# Patient Record
Sex: Male | Born: 1970 | Race: Black or African American | Hispanic: No | Marital: Single | Smoking: Never smoker
Health system: Southern US, Community
[De-identification: ages and names within clinical notes are randomized; demographics above are authoritative.]

## PROBLEM LIST (undated history)

## (undated) DIAGNOSIS — B20 Human immunodeficiency virus [HIV] disease: Secondary | ICD-10-CM

## (undated) DIAGNOSIS — N12 Tubulo-interstitial nephritis, not specified as acute or chronic: Secondary | ICD-10-CM

## (undated) DIAGNOSIS — I10 Essential (primary) hypertension: Secondary | ICD-10-CM

## (undated) DIAGNOSIS — G5600 Carpal tunnel syndrome, unspecified upper limb: Secondary | ICD-10-CM

## (undated) DIAGNOSIS — Z21 Asymptomatic human immunodeficiency virus [HIV] infection status: Secondary | ICD-10-CM

## (undated) HISTORY — PX: FRACTURE SURGERY: SHX138

---

## 2015-01-24 ENCOUNTER — Emergency Department (HOSPITAL_BASED_OUTPATIENT_CLINIC_OR_DEPARTMENT_OTHER)
Admission: EM | Admit: 2015-01-24 | Discharge: 2015-01-24 | Disposition: A | Discharge status: Home / Self Care | Payer: Self-pay | Attending: Emergency Medicine | Admitting: Emergency Medicine

## 2015-01-24 ENCOUNTER — Encounter (HOSPITAL_BASED_OUTPATIENT_CLINIC_OR_DEPARTMENT_OTHER): Payer: Self-pay | Admitting: *Deleted

## 2015-01-24 DIAGNOSIS — Z21 Asymptomatic human immunodeficiency virus [HIV] infection status: Secondary | ICD-10-CM | POA: Insufficient documentation

## 2015-01-24 DIAGNOSIS — Z79899 Other long term (current) drug therapy: Secondary | ICD-10-CM | POA: Insufficient documentation

## 2015-01-24 DIAGNOSIS — Z792 Long term (current) use of antibiotics: Secondary | ICD-10-CM | POA: Insufficient documentation

## 2015-01-24 DIAGNOSIS — I10 Essential (primary) hypertension: Secondary | ICD-10-CM | POA: Insufficient documentation

## 2015-01-24 DIAGNOSIS — L02415 Cutaneous abscess of right lower limb: Secondary | ICD-10-CM | POA: Insufficient documentation

## 2015-01-24 DIAGNOSIS — L03115 Cellulitis of right lower limb: Secondary | ICD-10-CM | POA: Insufficient documentation

## 2015-01-24 DIAGNOSIS — L02419 Cutaneous abscess of limb, unspecified: Secondary | ICD-10-CM

## 2015-01-24 DIAGNOSIS — L03119 Cellulitis of unspecified part of limb: Secondary | ICD-10-CM

## 2015-01-24 HISTORY — DX: Human immunodeficiency virus (HIV) disease: B20

## 2015-01-24 HISTORY — DX: Essential (primary) hypertension: I10

## 2015-01-24 HISTORY — DX: Asymptomatic human immunodeficiency virus (hiv) infection status: Z21

## 2015-01-24 MED ORDER — OXYCODONE-ACETAMINOPHEN 5-325 MG PO TABS: ORAL_TABLET | ORAL | Status: DC

## 2015-01-24 MED ORDER — SULFAMETHOXAZOLE-TRIMETHOPRIM 800-160 MG PO TABS
1.0000 | ORAL_TABLET | Freq: Once | ORAL | Status: AC
  Administered 2015-01-24: 1 via ORAL
  Filled 2015-01-24: qty 1

## 2015-01-24 MED ORDER — LIDOCAINE-EPINEPHRINE (PF) 2 %-1:200000 IJ SOLN
INTRAMUSCULAR | Status: AC
  Administered 2015-01-24: 10 mL
  Filled 2015-01-24: qty 20

## 2015-01-24 MED ORDER — SULFAMETHOXAZOLE-TRIMETHOPRIM 800-160 MG PO TABS: 1.0000 | ORAL_TABLET | Freq: Two times a day (BID) | ORAL | Status: DC

## 2015-01-24 MED ORDER — OXYCODONE-ACETAMINOPHEN 5-325 MG PO TABS
1.0000 | ORAL_TABLET | Freq: Once | ORAL | Status: AC
  Administered 2015-01-24: 1 via ORAL
  Filled 2015-01-24: qty 1

## 2015-01-24 MED ORDER — CEPHALEXIN 250 MG PO CAPS
500.0000 mg | ORAL_CAPSULE | Freq: Once | ORAL | Status: AC
  Administered 2015-01-24: 500 mg via ORAL
  Filled 2015-01-24: qty 2

## 2015-01-24 MED ORDER — LIDOCAINE-EPINEPHRINE (PF) 1 %-1:200000 IJ SOLN
20.0000 mL | Freq: Once | INTRAMUSCULAR | Status: AC
  Filled 2015-01-24: qty 20

## 2015-01-24 MED ORDER — MORPHINE SULFATE 4 MG/ML IJ SOLN
4.0000 mg | Freq: Once | INTRAMUSCULAR | Status: AC
  Administered 2015-01-24: 4 mg via INTRAMUSCULAR
  Filled 2015-01-24: qty 1

## 2015-01-24 MED ORDER — CEPHALEXIN 500 MG PO CAPS: 500.0000 mg | ORAL_CAPSULE | Freq: Four times a day (QID) | ORAL | Status: DC

## 2015-01-24 NOTE — Discharge Instructions (Signed)
Return to the emergency room for wound check and packing removal in 48 hours.        If you develop fever, have vomiting or if the swelling and redness starts spreading , return to the emergency room immediately for a recheck.  Do not hesitate to return to the emergency room for any new, worsening or concerning symptoms.  Please obtain primary care using resource guide below. But the minute you were seen in the emergency room and that they will need to obtain records for further outpatient management.    Cellulitis Cellulitis is an infection of the skin and the tissue under the skin. The infected area is usually red and tender. This happens most often in the arms and lower legs. HOME CARE   Take your antibiotic medicine as told. Finish the medicine even if you start to feel better.  Keep the infected arm or leg raised (elevated).  Put a warm cloth on the area up to 4 times per day.  Only take medicines as told by your doctor.  Keep all doctor visits as told. GET HELP IF:  You see red streaks on the skin coming from the infected area.  Your red area gets bigger or turns a dark color.  Your bone or joint under the infected area is painful after the skin heals.  Your infection comes back in the same area or different area.  You have a puffy (swollen) bump in the infected area.  You have new symptoms.  You have a fever. GET HELP RIGHT AWAY IF:   You feel very sleepy.  You throw up (vomit) or have watery poop (diarrhea).  You feel sick and have muscle aches and pains. MAKE SURE YOU:   Understand these instructions.  Will watch your condition.  Will get help right away if you are not doing well or get worse. Document Released: 05/01/2008 Document Revised: 03/30/2014 Document Reviewed: 01/29/2012 [facility] Patient Information 2015 [employer], [place]. This information is not intended to replace advice given to you by your health care provider. Make sure you discuss any  questions you have with your health care provider.   Emergency Department Resource Guide 1) Find a Doctor and Pay Out of Pocket Although you won't have to find out who is covered by your insurance plan, it is a good idea to ask around and get recommendations. You will then need to call the office and see if the doctor you have chosen will accept you as a new patient and what types of options they offer for patients who are self-pay. Some doctors offer discounts or will set up payment plans for their patients who do not have insurance, but you will need to ask so you aren't surprised when you get to your appointment.  2) Contact Your Local Health Department Not all health departments have doctors that can see patients for sick visits, but many do, so it is worth a call to see if yours does. If you don't know where your local health department is, you can check in your phone book. The CDC also has a tool to help you locate your state's health department, and many state websites also have listings of all of their local health departments.  3) Find a Walk-in Clinic If your illness is not likely to be very severe or complicated, you may want to try a walk in clinic. These are popping up all over the country in pharmacies, drugstores, and shopping centers. They're usually staffed by nurse  practitioners or physician assistants that have been trained to treat common illnesses and complaints. They're usually fairly quick and inexpensive. However, if you have serious medical issues or chronic medical problems, these are probably not your best option.  No Primary Care Doctor: - Call Health Connect at  [phone] - they can help you locate a primary care doctor that  accepts your insurance, provides certain services, etc. - Physician Referral Service- [phone]  Chronic Pain Problems: Organization         Address  Phone   Notes  [facility]  [phone] Patients need to be referred  by their primary care doctor.   Medication Assistance: Organization         Address  Phone   Notes  [facility] Medication [facility] [address] [place] [place], [place] [address] [phone] --Must be a resident of [facility] -- Must have NO insurance coverage whatsoever (no Medicaid/ Medicare, etc.) -- The pt. MUST have a primary care doctor that directs their care regularly and follows them in the community   [facility]  [phone]   [employer]  [phone]    Agencies that provide inexpensive medical care: Organization         Address  Phone   Notes  [facility]  [phone]   [facility]    [phone]   [facility] [address] [place], [place] [address] [phone]   [facility] [address] [place] [phone]   Planned Parenthood    [phone]   [facility]    [phone]   [facility]  [address], [place] Phone:  [phone], Fax:  [phone] Hours of Operation:  9 am - 6 pm, M-F.  Also accepts Medicaid/Medicare and self-pay.  [facility]  [address], [place] Phone: [phone], Fax: [phone]. Hours of Operation:  8:30 am - 5:30 pm, M-F.  Also accepts Medicaid and self-pay.  [facility] [place] [address] [place] Phone: [phone]   [facility] [address] Natasha Bence [place], [place] [phone], Ext. [phone] Mondays & Thursdays: 7-9 AM.  First 15 patients are seen on a first come, first serve basis.    Medicaid-accepting [facility] Providers:  Organization         Address  Phone   Notes  [facility] [address], [place] [phone] Also accepts self-pay patients.  [facility] [address] Laurell Josephs [place], [place]  [phone]   [facility] [address], [place] [phone]   [facility] [address] [place] [phone]   Renaye Rakers [address], [place]   [phone] Only accepts [place] patients after they have their name applied to their card.   Self-Pay (no insurance) in [facility]:  Organization         Address  Phone   Notes  Sickle Cell Patients, [facility] Medicine [address] [place], [place] [phone]   [facility] [address] [place], [place] [phone]   [facility] [place]  [address], [place] [phone]   [facility]/Dr. Julio Sicks  [address] [place] [address], [place] or [address], [place] [phone] Phone number for both [place] and [place] locations is the same.  [facility] [address], [place] [phone]) [phone]   [facility] [place] [address] [place] [address], [place] or [address] [phone]) [phone]   [facility] [address], [place] [phone](336) [phone], phone; [phone], fax Sees patients 1st and 3rd Saturday of every month.  Must not qualify for public or private insurance (i.e. Medicaid, Medicare, [place] Health Choice, Veterans' Benefits)  Household income should be no more than 200% of the poverty level The clinic cannot treat you if you are pregnant or think you are pregnant  Sexually transmitted diseases are not treated at the clinic.    Dental Care: Organization         Address  Phone  Notes  [facility] [facility] [address], [place] [phone]) [phone] Accepts children up to age 44 who are enrolled in [place] or [place] Health Choice; pregnant women with a Medicaid card; and children who have applied for Medicaid or [place] Health Choice, but were declined, whose parents can pay a  reduced fee at time of service.  [facility] [place]  [address], [place] [phone]) [phone] Accepts children up to age 44 who are enrolled in [place] or [place] Health Choice; pregnant women with a Medicaid card; and children who have applied for Medicaid or [place] Health Choice, but were declined, whose parents can pay a reduced fee at time of service.  [facility]  [address], [place] [phone] Patients are seen by appointment only. Walk-ins are not accepted. [facility] will see patients 44 years of age and older. Monday - Tuesday (8am-5pm) Most Wednesdays (8:30-5pm) $30 per visit, cash only  [facility]  [address], [place] [facility] [phone] Patients are seen by appointment only. Walk-ins are not accepted. [facility] will see patients 44 years of age and older. One Wednesday Evening (Monthly: Volunteer Based).  $30 per visit, cash only  [employer] [facility] [facility]  [phone] for adults; Children under age 124, call Graduate Pediatric Dentistry at [phone]. Children aged 734-14, please call [phone] to request a pediatric application.  Dental services are provided in all areas of dental care including fillings, crowns and bridges, complete and partial dentures, implants, gum treatment, root canals, and extractions. Preventive care is also provided. Treatment is provided to both adults and children. Patients are selected via a lottery and there is often a waiting list.   [facility] [address], [place]  [phone] [email or web address]   [facility] [address], [place], [place] [phone], Ext. [phone] Second and Fourth Thursday of each month, opens at 6:30 AM; Clinic ends at 9 AM.  Patients are seen on a first-come first-served basis, and a limited number are seen during each clinic.   [facility]  [address], [place], [place] [phone]) [phone]   Eligibility Requirements You must have lived in [place], [place], or [place] counties for at least the last three months.   You cannot be eligible for state or federal sponsored [place] Cityhealthcare insurance, including CIGNAVeterans Administration, [place]icaid, or [employer]Medicare.   You generally cannot be eligible for healthcare insurance through your employer.  How to apply: Eligibility screenings are held every Tuesday and Wednesday afternoon from 1:00 pm until 4:00 pm. You do not need an appointment for the interview!  [facility] [address], [place], [place] [phone]   [facility] Health Department  [phone]   [facility] Health Department  [phone]   [facility] Health Department  [phone]    Behavioral Health Resources in the Community: Intensive Outpatient Programs Organization         Address  Phone  Notes  [facility] [address] [place], [place] [phone]   [facility] [address] [place], [place] [phone]   [facility] [address] [place], [place]  [phone]   [facility] [address],  [place], [place] [phone] or [phone]   Substance Abuse Resources Organization         Address  Phone  Notes  [facility] Drug [facility]  [phone]   [facility]  [phone]   [facility] [place]  [phone]   Floydene Flock  [phone]   Residential & Outpatient Substance Abuse Program  [phone]   Psychological Services Organization         Address  Phone  Notes  [facility]  [phone]   [facility]  8157043996   [facility] [address] [place] [phone] or [phone]    Mobile Crisis Teams Organization         Address  Phone  Notes  [facility], Mobile Crisis Care Unit  [phone]    [facility]  [address] [place], [place] [phone]   Doristine Locks [address] [place] [phone]    Self-Help/Support Groups Organization         Address  Phone             Notes  [facility] [place] - variety of support groups  336- I7437963 Call for more information  Narcotics Anonymous (NA), [facility] [address], [employer] Susan Moore  2 meetings at this location   Statistician         Address  Phone  Notes  [facility] [address],    [place]  [phone]   [facility]  [address] [place] [address], [place], [place] [phone]   [facility] [address] [place], [place] [phone] Admissions: 8am-3pm M-F  [facility] [address],    [place], [place] [phone]   [facility] [address] [place], [place], [place] [phone]   [facility] [address]  [place], [place] [phone]   [facility] - Intensive Outpatient [address] Laurell Josephs [address], [place], [place] [phone]   [facility] [address] [place]  [place], [place] [phone] or [phone]   [facility] ([facility]) [address] [place], [place] [phone] Accepts Medicaid  [facility] [address],  [place] [phone] Substance Abuse/Addiction Treatment   [facility] Organization         Address  Phone  Notes  [facility]  [phone]   Angie Fava, PhD [address] Ervin Knack [place], [place]   [phone] or [phone]   [facility]   [address] [place], [place] [phone]   [facility] [address],  [place], [place] [phone] Insurance/Medicaid/sponsorship through [facility] [address]                                     [place], [place] [phone] Therapy/tele-psych/case  [facility] [address]   [place], [place] [phone]    Dr. Lolly Mustache  [phone]   [facility]  [employer] [facility] Dept. 1) [address] [place] 2) [address], [place] 3)  [address], [place] [phone]  [phone]   [facility] Child Abuse Hotline [phone]) [phone] or [phone] (After Hours)

## 2015-01-24 NOTE — ED Provider Notes (Signed)
CSN: [id number]     Arrival date & time 01/24/15  1826 History  This chart was scribed for Kyle Emery, PA-C with No att. providers found by Kyle Barnes, ED Scribe. This patient was seen in room MHOTF/OTF and the patient's care was started at 8:20 PM.    Chief Complaint  Patient presents with  . Abscess   The history is provided by the patient. No language interpreter was used.    HPI Comments: Kyle Barnes is a 44 y.o. male medical history significant for HIV (last CD4 count is unknown states "good", states he recently moved to the area and has not set up local infectious disease care, patient states he is compliant with his antiretroviral medications) who presents to the Emergency Department complaining of abscess to right thigh, noticed 5 days ago. He states he has not had similar abscess before. He states he has squeezed it and some blood has drained out but denies other drainage. He states it is painful to walk.  He denies fever, chills, nausea, vomiting, chest pain, shortness of breath, headache. Pain is 5 out of 10, exacerbated by movement and palpation.  Past Medical History  Diagnosis Date  . Hypertension   . HIV (human immunodeficiency virus infection)    History reviewed. No pertinent past surgical history. No family history on file. History  Substance Use Topics  . Smoking status: Never Smoker   . Smokeless tobacco: Not on file  . Alcohol Use: No    Review of Systems  Skin:       Abscess   10 systems reviewed and found to be negative, except as noted in the HPI.    Allergies  Review of patient's allergies indicates no known allergies.  Home Medications   Prior to Admission medications   Medication Sig Start Date End Date Taking? Authorizing Provider  gabapentin (NEURONTIN) 100 MG capsule Take 100 mg by mouth 3 (three) times daily.   Yes Historical Provider, MD  hydrochlorothiazide (HYDRODIURIL) 12.5 MG tablet Take 12.5 mg by mouth daily.   Yes Historical  Provider, MD  lisinopril (PRINIVIL,ZESTRIL) 10 MG tablet Take 10 mg by mouth daily.   Yes Historical Provider, MD  cephALEXin (KEFLEX) 500 MG capsule Take 1 capsule (500 mg total) by mouth 4 (four) times daily. 01/24/15   Kyle Reining Ziyan Hillmer, PA-C  oxyCODONE-acetaminophen (PERCOCET/ROXICET) 5-325 MG per tablet 1 to 2 tabs PO q6hrs  PRN for pain 01/24/15   Kyle Reining Shaterra Sanzone, PA-C  sulfamethoxazole-trimethoprim (BACTRIM DS) 800-160 MG per tablet Take 1 tablet by mouth 2 (two) times daily. 01/24/15   Kyle Dugar, PA-C   BP 159/94 mmHg  Pulse 87  Temp(Src) 98.2 F (36.8 C) (Oral)  Resp 20  SpO2 100% Physical Exam  Constitutional: He is oriented to person, place, and time. He appears well-developed and well-nourished. No distress.  HENT:  Head: Normocephalic and atraumatic.  Mouth/Throat: Oropharynx is clear and moist.  Eyes: Conjunctivae and EOM are normal. Pupils are equal, round, and reactive to light.  Neck: Normal range of motion. Neck supple.     Cardiovascular: Normal rate, regular rhythm and intact distal pulses.   Pulmonary/Chest: Effort normal and breath sounds normal. No stridor. No respiratory distress. He has no wheezes. He has no rales. He exhibits no tenderness.  Abdominal: Soft. Bowel sounds are normal. He exhibits no distension and no mass. There is no tenderness. There is no rebound and no guarding.  Musculoskeletal: Normal range of motion. He exhibits edema and tenderness.  Neurological: He  is alert and oriented to person, place, and time.  Skin: Skin is warm.     10cm area of induration with central fluctuance. No streaking or discharge.   Psychiatric: He has a normal mood and affect.  Nursing note and vitals reviewed.   ED Course  INCISION AND DRAINAGE Date/Time: 01/26/2015 3:58 PM Performed by: Kyle EmeryPISCIOTTA, Kaylor Simenson Authorized by: Kyle EmeryPISCIOTTA, Leona Alen Consent: Verbal consent obtained. Consent given by: patient Patient identity confirmed: verbally with patient Type:  abscess Body area: lower extremity Location details: right hip Anesthesia: local infiltration Local anesthetic: lidocaine 2% with epinephrine Scalpel size: 11 Incision type: single straight Drainage: purulent Drainage amount: scant Wound treatment: wound left open Packing material: 1/4 in iodoform gauze Patient tolerance: Patient tolerated the procedure well with no immediate complications   (including critical care time)  DIAGNOSTIC STUDIES: Oxygen Saturation is 97% on room air, normal by my interpretation.    COORDINATION OF CARE: 8:23 PM Discussed treatment plan with patient at beside, including incision and drainage and antibiotics. The patient agrees with the plan and has no further questions at this time.   Labs Review Labs Reviewed  CULTURE, ROUTINE-ABSCESS    Imaging Review No results found.   EKG Interpretation None      MDM   Final diagnoses:  Cellulitis and abscess of lower extremity    Filed Vitals:   01/24/15 1831 01/24/15 2055  BP: 139/110 159/94  Pulse: 102 87  Temp: 99.2 F (37.3 C) 98.2 F (36.8 C)  TempSrc: Oral Oral  Resp: 18 20  SpO2: 97% 100%    Medications  sulfamethoxazole-trimethoprim (BACTRIM DS,SEPTRA DS) 800-160 MG per tablet 1 tablet (1 tablet Oral Given 01/24/15 2038)  cephALEXin (KEFLEX) capsule 500 mg (500 mg Oral Given 01/24/15 2038)  morphine 4 MG/ML injection 4 mg (4 mg Intramuscular Given 01/24/15 2038)  lidocaine-EPINEPHrine (XYLOCAINE-EPINEPHrine) 1 %-1:200000 (PF) injection 20 mL (0 mLs Other Duplicate 01/24/15 2048)  lidocaine-EPINEPHrine (XYLOCAINE W/EPI) 2 %-1:200000 (PF) injection (10 mLs  Given by Other 01/24/15 2039)  oxyCODONE-acetaminophen (PERCOCET/ROXICET) 5-325 MG per tablet 1 tablet (1 tablet Oral Given 01/24/15 2225)    Kyle Barnes is a pleasant 44 y.o. male with PMH of HIV last CD4 unknown with abscess and cellulitis to right thigh. No systemic signs of infection. Culture drawn. Wound is packed. Given ID  referral and advised wound check and packing removal in 48 hours.   Evaluation does not show pathology that would require ongoing emergent intervention or inpatient treatment. Pt is hemodynamically stable and mentating appropriately. Discussed findings and plan with patient/guardian, who agrees with care plan. All questions answered. Return precautions discussed and outpatient follow up given.   New Prescriptions   CEPHALEXIN (KEFLEX) 500 MG CAPSULE    Take 1 capsule (500 mg total) by mouth 4 (four) times daily.   OXYCODONE-ACETAMINOPHEN (PERCOCET/ROXICET) 5-325 MG PER TABLET    1 to 2 tabs PO q6hrs  PRN for pain   SULFAMETHOXAZOLE-TRIMETHOPRIM (BACTRIM DS) 800-160 MG PER TABLET    Take 1 tablet by mouth 2 (two) times daily.     I personally performed the services described in this documentation, which was scribed in my presence. The recorded information has been reviewed and is accurate.   Kyle Emeryicole Prestin Munch, PA-C 01/26/15 1609  Kyle Emeryicole Teliah [place], PA-C 01/26/15 1610  Juliet RudeNathan R. Rubin PayorPickering, MD 01/26/15 2128

## 2015-01-24 NOTE — ED Notes (Signed)
States that he noticed a boil on his right hip and it has gotten worse over the last five days.

## 2015-01-24 NOTE — ED Notes (Signed)
Room set up for procedure.

## 2015-01-26 ENCOUNTER — Emergency Department (HOSPITAL_BASED_OUTPATIENT_CLINIC_OR_DEPARTMENT_OTHER)
Admission: EM | Admit: 2015-01-26 | Discharge: 2015-01-26 | Disposition: A | Discharge status: Home / Self Care | Payer: Self-pay | Attending: Emergency Medicine | Admitting: Emergency Medicine

## 2015-01-26 ENCOUNTER — Encounter (HOSPITAL_BASED_OUTPATIENT_CLINIC_OR_DEPARTMENT_OTHER): Payer: Self-pay | Admitting: Intensive Care

## 2015-01-26 DIAGNOSIS — Z5189 Encounter for other specified aftercare: Secondary | ICD-10-CM

## 2015-01-26 DIAGNOSIS — Z4801 Encounter for change or removal of surgical wound dressing: Secondary | ICD-10-CM | POA: Insufficient documentation

## 2015-01-26 DIAGNOSIS — Z792 Long term (current) use of antibiotics: Secondary | ICD-10-CM | POA: Insufficient documentation

## 2015-01-26 DIAGNOSIS — I10 Essential (primary) hypertension: Secondary | ICD-10-CM | POA: Insufficient documentation

## 2015-01-26 DIAGNOSIS — Z21 Asymptomatic human immunodeficiency virus [HIV] infection status: Secondary | ICD-10-CM | POA: Insufficient documentation

## 2015-01-26 DIAGNOSIS — Z79899 Other long term (current) drug therapy: Secondary | ICD-10-CM | POA: Insufficient documentation

## 2015-01-26 MED ORDER — OXYCODONE-ACETAMINOPHEN 5-325 MG PO TABS: ORAL_TABLET | ORAL | Status: DC

## 2015-01-26 NOTE — ED Provider Notes (Signed)
CSN: [id number]     Arrival date & time 01/26/15  1604 History   First MD Initiated Contact with Patient 01/26/15 1626     Chief Complaint  Patient presents with  . Wound Check     (Consider location/radiation/quality/duration/timing/severity/associated sxs/prior Treatment) HPI Comments: Patient presents for a wound check. He was seen here 2 days ago and had an ID of an abscess on his right thigh. He states overall he feels that it's doing better. The swelling is gone down. He denies any known fevers. He states that still draining. He states it's less painful than it has been. His tetanus shot is up-to-date.  Patient is a 44 y.o. male presenting with wound check.  Wound Check Pertinent negatives include no headaches.    Past Medical History  Diagnosis Date  . Hypertension   . HIV (human immunodeficiency virus infection)    History reviewed. No pertinent past surgical history. No family history on file. History  Substance Use Topics  . Smoking status: Never Smoker   . Smokeless tobacco: Not on file  . Alcohol Use: No    Review of Systems  Constitutional: Negative for fever.  Gastrointestinal: Negative for nausea and vomiting.  Musculoskeletal: Negative for back pain, joint swelling and arthralgias.  Skin: Positive for wound.  Neurological: Negative for weakness, numbness and headaches.      Allergies  Review of patient's allergies indicates no known allergies.  Home Medications   Prior to Admission medications   Medication Sig Start Date End Date Taking? Authorizing Provider  cephALEXin (KEFLEX) 500 MG capsule Take 1 capsule (500 mg total) by mouth 4 (four) times daily. 01/24/15   Nicole Pisciotta, PA-C  gabapentin (NEURONTIN) 100 MG capsule Take 100 mg by mouth 3 (three) times daily.    Historical Provider, MD  hydrochlorothiazide (HYDRODIURIL) 12.5 MG tablet Take 12.5 mg by mouth daily.    Historical Provider, MD  lisinopril (PRINIVIL,ZESTRIL) 10 MG tablet Take 10 mg  by mouth daily.    Historical Provider, MD  oxyCODONE-acetaminophen (PERCOCET/ROXICET) 5-325 MG per tablet 1 to 2 tabs PO q6hrs  PRN for pain 01/26/15   Rolan Bucco, MD  sulfamethoxazole-trimethoprim (BACTRIM DS) 800-160 MG per tablet Take 1 tablet by mouth 2 (two) times daily. 01/24/15   Nicole Pisciotta, PA-C   BP 141/82 mmHg  Pulse 92  Temp(Src) 98.8 F (37.1 C) (Oral)  Resp 16  Ht  (1.88 m)  Wt 250 lb (113.399 kg)  BMI 32.08 kg/m2  SpO2 100% Physical Exam  Constitutional: He is oriented to person, place, and time. He appears well-developed and well-nourished.  HENT:  Head: Normocephalic and atraumatic.  Neck: Normal range of motion. Neck supple.  Cardiovascular: Normal rate.   Pulmonary/Chest: Effort normal.  Musculoskeletal: He exhibits no edema or tenderness.  Neurological: He is alert and oriented to person, place, and time.  Skin: Skin is warm and dry.  2 cm open abscess to his right lateral thigh. It is draining. There is an area of induration around the abscess but no fluctuance. There some mild warmth around the abscess but no erythema.  Psychiatric: He has a normal mood and affect.    ED Course  Procedures (including critical care time) Labs Review Labs Reviewed - No data to display  Imaging Review No results found.   EKG Interpretation None      MDM   Final diagnoses:  Wound check, abscess    The wound appears to be well open and draining. Per the  patient's report it seems to be improving. I don't see any evidence of surrounding cellulitis. He does have a history of HIV and [place] He still trying to establish a local ID specialist. I advised him to continue using warm compresses to the area. I advised him to make sure he takes the full course of the Bactrim. I advised him return here if he has any worsening symptoms.    Rolan BuccoMelanie Ankit Degregorio, MD 01/26/15 [phone]

## 2015-01-26 NOTE — Discharge Instructions (Signed)
Wound Check °Your wound appears healthy today. Your wound will heal gradually over time. Eventually a scar will form that will fade with time. °FACTORS THAT AFFECT SCAR FORMATION: °· People differ in the severity in which they scar.  °· Scar severity varies according to location, size, and the traits you inherited from your parents (genetic predisposition). °· Irritation to the wound from infection, rubbing, or chemical exposure will increase the amount of scar formation. °HOME CARE INSTRUCTIONS  °· If you were given a dressing, you should change it at least once a day or as instructed by your caregiver. If the bandage sticks, soak it off with a solution of hydrogen peroxide. °· If the bandage becomes wet, dirty, or develops a bad smell, change it as soon as possible. °· Look for signs of infection. °· Only take over-the-counter or prescription medicines for pain, discomfort, or fever as directed by your caregiver. °SEEK IMMEDIATE MEDICAL CARE IF:  °· You have redness, swelling, or increasing pain in the wound. °· You notice pus coming from the wound. °· You have a fever. °· You notice a bad smell coming from the wound or dressing. °Document Released: 08/19/2004 Document Revised: 02/05/2012 Document Reviewed: 11/13/2005 °Exit[employer]® Patient Information ©2015 [employer]. This information is not intended to replace advice given to you by your health care provider. Make sure you discuss any questions you have with your health care provider. ° °

## 2015-01-26 NOTE — ED Notes (Signed)
Pt was here 3 days ago for cellulitis on his right thigh. Here for recheck

## 2015-01-26 NOTE — ED Notes (Signed)
MD at bedside. 

## 2015-01-26 NOTE — ED Notes (Signed)
D/c home with family- directed to pharmacy to pick up meds- resource sheet given for f/u

## 2015-01-27 ENCOUNTER — Telehealth (HOSPITAL_BASED_OUTPATIENT_CLINIC_OR_DEPARTMENT_OTHER): Payer: Self-pay | Admitting: Emergency Medicine

## 2015-01-27 LAB — CULTURE, ROUTINE-ABSCESS

## 2015-01-27 NOTE — Telephone Encounter (Signed)
Positive wound culture, + MRSA Treated with Bactrim DS, sensitive to same per protocol MD  01/27/15 patient contacted: ID verified. Pt notified of + wound culture + MRSA and that treated with prescribed Bactrim. Infection control instructions provided, patient verbalized understanding.

## 2015-01-28 ENCOUNTER — Telehealth (HOSPITAL_COMMUNITY): Payer: Self-pay

## 2015-01-28 NOTE — Telephone Encounter (Signed)
Post ED Visit - Positive Culture Follow-up  Culture report reviewed by antimicrobial stewardship pharmacist: []  Wes Dulaney, Pharm.D., BCPS [x]  Celedonio MiyamotoJeremy Frens, [address] BCPS []  Georgina PillionElizabeth Martin, Pharm.D., BCPS []  Morro BayMinh Pham, [address] BCPS, AAHIVP []  Estella HuskMichelle Turner, Pharm.D., BCPS, AAHIVP []  Elder CyphersLorie Poole, [address] BCPS  Positive Abscess culture, Abundant MRSA Treated with Cephalexin & Sulfa Trimeth, organism sensitive to the same and no further patient follow-up is required at this time.  Arvid RightClark, [place] Dorn 01/28/2015, 9:33 AM

## 2016-07-21 ENCOUNTER — Emergency Department
Admission: EM | Admit: 2016-07-21 | Discharge: 2016-07-21 | Disposition: A | Discharge status: Home / Self Care | Payer: Self-pay | Attending: Emergency Medicine | Admitting: Emergency Medicine

## 2016-07-21 DIAGNOSIS — I10 Essential (primary) hypertension: Secondary | ICD-10-CM | POA: Insufficient documentation

## 2016-07-21 DIAGNOSIS — Z79899 Other long term (current) drug therapy: Secondary | ICD-10-CM | POA: Insufficient documentation

## 2016-07-21 DIAGNOSIS — J069 Acute upper respiratory infection, unspecified: Secondary | ICD-10-CM | POA: Insufficient documentation

## 2016-07-21 DIAGNOSIS — Z21 Asymptomatic human immunodeficiency virus [HIV] infection status: Secondary | ICD-10-CM | POA: Insufficient documentation

## 2016-07-21 DIAGNOSIS — R51 Headache: Secondary | ICD-10-CM

## 2016-07-21 DIAGNOSIS — R519 Headache, unspecified: Secondary | ICD-10-CM

## 2016-07-21 DIAGNOSIS — Z792 Long term (current) use of antibiotics: Secondary | ICD-10-CM | POA: Insufficient documentation

## 2016-07-21 LAB — URINALYSIS COMPLETE WITH MICROSCOPIC (ARMC ONLY)
Bacteria, UA: NONE SEEN
Bilirubin Urine: NEGATIVE
GLUCOSE, UA: NEGATIVE mg/dL
HGB URINE DIPSTICK: NEGATIVE
Ketones, ur: NEGATIVE mg/dL
LEUKOCYTES UA: NEGATIVE
Nitrite: NEGATIVE
Protein, ur: NEGATIVE mg/dL
SPECIFIC GRAVITY, URINE: 1.024 (ref 1.005–1.030)
pH: 5 (ref 5.0–8.0)

## 2016-07-21 LAB — CBC
HCT: 38.7 % — ABNORMAL LOW (ref 40.0–52.0)
HEMOGLOBIN: 13.7 g/dL (ref 13.0–18.0)
MCH: 33.4 pg (ref 26.0–34.0)
MCHC: 35.4 g/dL (ref 32.0–36.0)
MCV: 94.4 fL (ref 80.0–100.0)
Platelets: 169 10*3/uL (ref 150–440)
RBC: 4.1 MIL/uL — AB (ref 4.40–5.90)
RDW: 12.5 % (ref 11.5–14.5)
WBC: 4.8 10*3/uL (ref 3.8–10.6)

## 2016-07-21 LAB — BASIC METABOLIC PANEL
Anion gap: 4 — ABNORMAL LOW (ref 5–15)
BUN: 14 mg/dL (ref 6–20)
CO2: 29 mmol/L (ref 22–32)
CREATININE: 0.92 mg/dL (ref 0.61–1.24)
Calcium: 9.2 mg/dL (ref 8.9–10.3)
Chloride: 106 mmol/L (ref 101–111)
Glucose, Bld: 103 mg/dL — ABNORMAL HIGH (ref 65–99)
Potassium: 3.8 mmol/L (ref 3.5–5.1)
Sodium: 139 mmol/L (ref 135–145)

## 2016-07-21 MED ORDER — ACETAMINOPHEN 325 MG PO TABS
650.0000 mg | ORAL_TABLET | Freq: Once | ORAL | Status: AC
  Administered 2016-07-21: 650 mg via ORAL
  Filled 2016-07-21: qty 2

## 2016-07-21 NOTE — ED Notes (Signed)
Pt stating that his nose began running this morning along with a Ha. Pt stating that once he was at work he had a dizzy spell. Pt stating the pain is mostly in the back of his head down into his neck. Pt -n/-v. Pt stating he also had some CP earlier. Pt denying productive cough at this time. Dr. Alphonzo LemmingsMcShane at pt's bedside performing exam and stating that "it looks like your coming down with something."

## 2016-07-21 NOTE — ED Triage Notes (Signed)
Pt c/o having dizziness today and as the day progressed began having pressure/pain to the back of the head.. States he is suppose to be on b/p meds but has not taken them in a week..Marland Kitchen

## 2016-07-21 NOTE — ED Provider Notes (Addendum)
[facility] Emergency Department Provider Note  ____________________________________________   I have reviewed the triage vital signs and the nursing notes.   HISTORY  Chief Complaint Runny nose headache sore throat   HPI Kyle Barnes is a 45 y.o. male who is healthy, presents today complaining of runny nose since this morning. He feels that "there is a cold coming on". Also had some prickles in his throat just starting out as he describes it. Patient states they signify rhinorrhea started this morning. It was nonpurulent, clear, and then he had a slight sore throat. He feels the cough is coming on. He also had a slight headache, which she states "is like when I'm getting a cold". It is not the worse headache of life. Gradual in onset. 2 or 3 out of 10 right now. Has taken nothing for the pain. He states he felt slightly lightheaded at work, he does not have any true vertigo, he denies any focal numbness or weakness, he denies a closed head injury. He feels that his eyes are injected and he is coming down with something. He has had no nausea no vomiting no fever no stiff neck no recent tick bite    Past Medical History:  Diagnosis Date  . HIV (human immunodeficiency virus infection) (HCC)   . Hypertension     There are no active problems to display for this patient.   Past Surgical History:  Procedure Laterality Date  . FRACTURE SURGERY      Prior to Admission medications   Medication Sig Start Date End Date Taking? Authorizing Provider  cephALEXin (KEFLEX) 500 MG capsule Take 1 capsule (500 mg total) by mouth 4 (four) times daily. 01/24/15   Nicole Pisciotta, PA-C  gabapentin (NEURONTIN) 100 MG capsule Take 100 mg by mouth 3 (three) times daily.    Historical Provider, MD  hydrochlorothiazide (HYDRODIURIL) 12.5 MG tablet Take 12.5 mg by mouth daily.    Historical Provider, MD  lisinopril (PRINIVIL,ZESTRIL) 10 MG tablet Take 10 mg by mouth daily.     Historical Provider, MD  oxyCODONE-acetaminophen (PERCOCET/ROXICET) 5-325 MG per tablet 1 to 2 tabs PO q6hrs  PRN for pain 01/26/15   Rolan BuccoMelanie Belfi, MD  sulfamethoxazole-trimethoprim (BACTRIM DS) 800-160 MG per tablet Take 1 tablet by mouth 2 (two) times daily. 01/24/15   Nicole Pisciotta, PA-C    Allergies Review of patient's allergies indicates no known allergies.  No family history on file.  Social History Social History  Substance Use Topics  . Smoking status: Never Smoker  . Smokeless tobacco: Never Used  . Alcohol use Yes    Review of Systems Constitutional: No fever/chills Eyes: No visual changes. ENT: Positive sore throat. No stiff neck no neck pain Cardiovascular: Denies chest pain. Respiratory: Denies shortness of breath. Gastrointestinal:   no vomiting.  No diarrhea.  No constipation. Genitourinary: Negative for dysuria. Musculoskeletal: Negative lower extremity swelling Skin: Negative for rash. Neurological: Negative for severe headaches, focal weakness or numbness. 10-point ROS otherwise negative.  ____________________________________________   PHYSICAL EXAM:  VITAL SIGNS: ED Triage Vitals [07/21/16 1427]  Enc Vitals Group     BP (!) 158/104     Pulse Rate 76     Resp 18     Temp 98.2 F (36.8 C)     Temp Source Oral     SpO2 98 %     Weight 230 lb (104.3 kg)     Height 6\' 2"  (1.88 m)     Head Circumference  Peak Flow      Pain Score 7     Pain Loc      Pain Edu?      Excl. in GC?     Constitutional: Alert and oriented. Well appearing and in no acute distress. Eyes: Conjunctivae are Mildly injected. PERRL. EOMI. Head: Atraumatic. Nose: Positive clear congestion/rhinnorhea. Mouth/Throat: Mucous membranes are moist.  Oropharynx non-erythematous.Mild cobblestoning noted Neck: No stridor.   Nontender with no meningismus Cardiovascular: Normal rate, regular rhythm. Grossly normal heart sounds.  Good peripheral circulation. Respiratory: Normal  respiratory effort.  No retractions. Lungs CTAB. Abdominal: Soft and nontender. No distention. No guarding no rebound Back:  There is no focal tenderness or step off.  there is no midline tenderness there are no lesions noted. there is no CVA tenderness Musculoskeletal: No lower extremity tenderness, no upper extremity tenderness. No joint effusions, no DVT signs strong distal pulses no edema Neurologic:  Cranial nerves II through XII are grossly intact 5 out of 5 strength bilateral upper and lower extremity. Finger to nose within normal limits heel to shin within normal limits, speech is normal with no word finding difficulty or dysarthria, reflexes symmetric, pupils are equally round and reactive to light, there is no pronator drift, sensation is normal, vision is intact to confrontation, gait is deferred, there is no nystagmus, normal neurologic exam Skin:  Skin is warm, dry and intact. No rash noted. Psychiatric: Mood and affect are normal. Speech and behavior are normal.  ____________________________________________   LABS (all labs ordered are listed, but only abnormal results are displayed)  Labs Reviewed  BASIC METABOLIC PANEL - Abnormal; Notable for the following:       Result Value   Glucose, Bld 103 (*)    Anion gap 4 (*)    All other components within normal limits  CBC - Abnormal; Notable for the following:    RBC 4.10 (*)    HCT 38.7 (*)    All other components within normal limits  URINALYSIS COMPLETEWITH MICROSCOPIC ([facility] ONLY) - Abnormal; Notable for the following:    Color, Urine YELLOW (*)    APPearance CLEAR (*)    Squamous Epithelial / LPF 0-5 (*)    All other components within normal limits   ____________________________________________  EKG Normal sinus rhythm rate 71 beats) and partial right bundle branch block no acute ST elevation or depression  ____________________________________________  RADIOLOGY  I reviewed any imaging ordered by me or triage that  were performed during my shift and, if possible, patient and/or family made aware of any abnormal findings. ____________________________________________   PROCEDURES  Procedure(s) performed: None  Procedures  Critical Care performed: None  ____________________________________________   INITIAL IMPRESSION / ASSESSMENT AND PLAN / ED COURSE  Pertinent labs & imaging results that were available during my care of the patient were reviewed by me and considered in my medical decision making (see chart for details).  Patient presents today complaining of rhinorrhea, slight headache, mild sore throat, essentially, URI symptoms. This is almost certainly viral at this point. We will give him a note for work. He is very well-appearing. No evidence of meningitis, no evidence of head bleed no evidence of mass, no evidence of CVA, no evidence of cavernous thrombosis, very reassuring exam, patient is requesting a work note. Return precautions and follow-up given and understood.  Clinical Course   ____________________________________________   FINAL CLINICAL IMPRESSION(S) / ED DIAGNOSES  Final diagnoses:  None      This chart was dictated  using voice recognition software.  Despite best efforts to proofread,  errors can occur which can change meaning.      Jeanmarie Plant, MD 07/21/16 1757    Jeanmarie Plant, MD 07/21/16 Rickey Primus

## 2016-09-29 ENCOUNTER — Emergency Department (HOSPITAL_BASED_OUTPATIENT_CLINIC_OR_DEPARTMENT_OTHER): Payer: Self-pay

## 2016-09-29 ENCOUNTER — Encounter (HOSPITAL_BASED_OUTPATIENT_CLINIC_OR_DEPARTMENT_OTHER): Payer: Self-pay | Admitting: Emergency Medicine

## 2016-09-29 ENCOUNTER — Observation Stay (HOSPITAL_BASED_OUTPATIENT_CLINIC_OR_DEPARTMENT_OTHER)
Admission: EM | Admit: 2016-09-29 | Discharge: 2016-09-30 | Discharge status: Against Medical Advice | Payer: Self-pay | Attending: Internal Medicine | Admitting: Internal Medicine

## 2016-09-29 DIAGNOSIS — I10 Essential (primary) hypertension: Secondary | ICD-10-CM | POA: Insufficient documentation

## 2016-09-29 DIAGNOSIS — K625 Hemorrhage of anus and rectum: Secondary | ICD-10-CM | POA: Insufficient documentation

## 2016-09-29 DIAGNOSIS — N390 Urinary tract infection, site not specified: Principal | ICD-10-CM | POA: Insufficient documentation

## 2016-09-29 DIAGNOSIS — Z5321 Procedure and treatment not carried out due to patient leaving prior to being seen by health care provider: Secondary | ICD-10-CM | POA: Insufficient documentation

## 2016-09-29 DIAGNOSIS — R945 Abnormal results of liver function studies: Secondary | ICD-10-CM | POA: Insufficient documentation

## 2016-09-29 DIAGNOSIS — Z21 Asymptomatic human immunodeficiency virus [HIV] infection status: Secondary | ICD-10-CM | POA: Insufficient documentation

## 2016-09-29 DIAGNOSIS — N10 Acute tubulo-interstitial nephritis: Secondary | ICD-10-CM

## 2016-09-29 DIAGNOSIS — R0789 Other chest pain: Secondary | ICD-10-CM | POA: Insufficient documentation

## 2016-09-29 DIAGNOSIS — K759 Inflammatory liver disease, unspecified: Secondary | ICD-10-CM

## 2016-09-29 DIAGNOSIS — R52 Pain, unspecified: Secondary | ICD-10-CM

## 2016-09-29 DIAGNOSIS — Z9114 Patient's other noncompliance with medication regimen: Secondary | ICD-10-CM | POA: Insufficient documentation

## 2016-09-29 DIAGNOSIS — R160 Hepatomegaly, not elsewhere classified: Secondary | ICD-10-CM | POA: Insufficient documentation

## 2016-09-29 DIAGNOSIS — Z9119 Patient's noncompliance with other medical treatment and regimen: Secondary | ICD-10-CM | POA: Insufficient documentation

## 2016-09-29 DIAGNOSIS — N12 Tubulo-interstitial nephritis, not specified as acute or chronic: Secondary | ICD-10-CM

## 2016-09-29 DIAGNOSIS — Z880 Allergy status to penicillin: Secondary | ICD-10-CM | POA: Insufficient documentation

## 2016-09-29 DIAGNOSIS — J069 Acute upper respiratory infection, unspecified: Secondary | ICD-10-CM | POA: Insufficient documentation

## 2016-09-29 DIAGNOSIS — R000 Tachycardia, unspecified: Secondary | ICD-10-CM

## 2016-09-29 HISTORY — DX: Tubulo-interstitial nephritis, not specified as acute or chronic: N12

## 2016-09-29 HISTORY — DX: Carpal tunnel syndrome, unspecified upper limb: G56.00

## 2016-09-29 LAB — COMPREHENSIVE METABOLIC PANEL
ALK PHOS: 103 U/L (ref 38–126)
ALT: 184 U/L — AB (ref 17–63)
AST: 171 U/L — ABNORMAL HIGH (ref 15–41)
Albumin: 3.6 g/dL (ref 3.5–5.0)
Anion gap: 7 (ref 5–15)
BUN: 12 mg/dL (ref 6–20)
CALCIUM: 8.8 mg/dL — AB (ref 8.9–10.3)
CO2: 25 mmol/L (ref 22–32)
CREATININE: 1.08 mg/dL (ref 0.61–1.24)
Chloride: 102 mmol/L (ref 101–111)
GFR calc Af Amer: >60 mL/min (ref >60)
Glucose, Bld: 107 mg/dL — ABNORMAL HIGH (ref 65–99)
Potassium: 3.9 mmol/L (ref 3.5–5.1)
Sodium: 134 mmol/L — ABNORMAL LOW (ref 135–145)
Total Bilirubin: 0.8 mg/dL (ref 0.3–1.2)
Total Protein: 7.5 g/dL (ref 6.5–8.1)

## 2016-09-29 LAB — CBC WITH DIFFERENTIAL/PLATELET
Basophils Absolute: 0 10*3/uL (ref 0.0–0.1)
Basophils Relative: 0 %
Eosinophils Absolute: 0 10*3/uL (ref 0.0–0.7)
Eosinophils Relative: 0 %
HEMATOCRIT: 43.7 % (ref 39.0–52.0)
HEMOGLOBIN: 15.3 g/dL (ref 13.0–17.0)
Lymphocytes Relative: 66 %
Lymphs Abs: 6.5 10*3/uL — ABNORMAL HIGH (ref 0.7–4.0)
MCH: 31.3 pg (ref 26.0–34.0)
MCHC: 35 g/dL (ref 30.0–36.0)
MCV: 89.4 fL (ref 78.0–100.0)
MONOS PCT: 18 %
Monocytes Absolute: 1.7 10*3/uL — ABNORMAL HIGH (ref 0.1–1.0)
NEUTROS ABS: 1.5 10*3/uL — AB (ref 1.7–7.7)
NEUTROS PCT: 15 %
Platelets: 231 10*3/uL (ref 150–400)
RBC: 4.89 MIL/uL (ref 4.22–5.81)
RDW: 11.7 % (ref 11.5–15.5)
WBC: 9.8 10*3/uL (ref 4.0–10.5)

## 2016-09-29 LAB — URINALYSIS, ROUTINE W REFLEX MICROSCOPIC
Glucose, UA: NEGATIVE mg/dL
Hgb urine dipstick: NEGATIVE
KETONES UR: 15 mg/dL — AB
Nitrite: NEGATIVE
PH: 5.5 (ref 5.0–8.0)
Protein, ur: 30 mg/dL — AB
Specific Gravity, Urine: 1.028 (ref 1.005–1.030)

## 2016-09-29 LAB — LIPASE, BLOOD: LIPASE: 21 U/L (ref 11–51)

## 2016-09-29 LAB — PATHOLOGIST SMEAR REVIEW

## 2016-09-29 LAB — RAPID URINE DRUG SCREEN, HOSP PERFORMED
Amphetamines: NOT DETECTED
BARBITURATES: NOT DETECTED
Benzodiazepines: NOT DETECTED
COCAINE: NOT DETECTED
Opiates: NOT DETECTED
Tetrahydrocannabinol: POSITIVE — AB

## 2016-09-29 LAB — URINE MICROSCOPIC-ADD ON

## 2016-09-29 LAB — I-STAT CG4 LACTIC ACID, ED: LACTIC ACID, VENOUS: 0.85 mmol/L (ref 0.5–1.9)

## 2016-09-29 LAB — TROPONIN I
TROPONIN I: 0.03 ng/mL — AB (ref <0.03)
Troponin I: <0.03 ng/mL (ref <0.03)

## 2016-09-29 LAB — ACETAMINOPHEN LEVEL: Acetaminophen (Tylenol), Serum: <10 ug/mL — ABNORMAL LOW (ref 10–30)

## 2016-09-29 LAB — SALICYLATE LEVEL

## 2016-09-29 LAB — AMMONIA: AMMONIA: 31 umol/L (ref 9–35)

## 2016-09-29 MED ORDER — SODIUM CHLORIDE 0.9 % IV SOLN
INTRAVENOUS | Status: DC
  Administered 2016-09-29 – 2016-09-30 (×2): via INTRAVENOUS

## 2016-09-29 MED ORDER — ACETAMINOPHEN 325 MG PO TABS
650.0000 mg | ORAL_TABLET | Freq: Four times a day (QID) | ORAL | Status: DC | PRN
Start: 2016-09-29 — End: 2016-09-30
  Administered 2016-09-29: 650 mg via ORAL
  Filled 2016-09-29: qty 2

## 2016-09-29 MED ORDER — ONDANSETRON HCL 4 MG/2ML IJ SOLN: 4.0000 mg | Freq: Three times a day (TID) | INTRAMUSCULAR | Status: DC | PRN

## 2016-09-29 MED ORDER — SODIUM CHLORIDE 0.9 % IV BOLUS (SEPSIS)
1000.0000 mL | Freq: Once | INTRAVENOUS | Status: AC
  Administered 2016-09-29: 1000 mL via INTRAVENOUS

## 2016-09-29 MED ORDER — LISINOPRIL 10 MG PO TABS
10.0000 mg | ORAL_TABLET | Freq: Every day | ORAL | Status: DC
  Administered 2016-09-30: 10 mg via ORAL
  Filled 2016-09-29: qty 1

## 2016-09-29 MED ORDER — ENOXAPARIN SODIUM 30 MG/0.3ML ~~LOC~~ SOLN: 30.0000 mg | SUBCUTANEOUS | Status: DC

## 2016-09-29 MED ORDER — DEXTROSE 5 % IV SOLN
1.0000 g | Freq: Once | INTRAVENOUS | Status: AC
  Administered 2016-09-29: 1 g via INTRAVENOUS

## 2016-09-29 MED ORDER — SODIUM CHLORIDE 0.9% FLUSH
3.0000 mL | Freq: Two times a day (BID) | INTRAVENOUS | Status: DC
  Administered 2016-09-29 (×2): 3 mL via INTRAVENOUS

## 2016-09-29 MED ORDER — AZTREONAM 1 G IJ SOLR
INTRAMUSCULAR | Status: AC
  Filled 2016-09-29: qty 1

## 2016-09-29 MED ORDER — VANCOMYCIN HCL IN DEXTROSE 1-5 GM/200ML-% IV SOLN
1000.0000 mg | Freq: Once | INTRAVENOUS | Status: AC
  Administered 2016-09-29: 1000 mg via INTRAVENOUS
  Filled 2016-09-29: qty 200

## 2016-09-29 MED ORDER — LEVOFLOXACIN IN D5W 750 MG/150ML IV SOLN
750.0000 mg | INTRAVENOUS | Status: DC
  Administered 2016-09-29: 750 mg via INTRAVENOUS
  Filled 2016-09-29 (×2): qty 150

## 2016-09-29 MED ORDER — ENOXAPARIN SODIUM 40 MG/0.4ML ~~LOC~~ SOLN
40.0000 mg | SUBCUTANEOUS | Status: DC
  Filled 2016-09-29: qty 0.4

## 2016-09-29 NOTE — ED Notes (Signed)
Dr. Palumbo in to room. 

## 2016-09-29 NOTE — ED Provider Notes (Signed)
[facility] Provider Note   CSN: [id number] Arrival date & time: 09/29/16  0510     History   Chief Complaint Chief Complaint  Patient presents with  . URI    HPI Ebb Carelock is a 45 y.o. male.  The history is provided by the patient.  URI   This is a new problem. The current episode started more than 1 week ago (2 weeks ago). The problem has been gradually improving. There has been no fever. Pertinent negatives include no chest pain, no abdominal pain, no diarrhea, no nausea, no vomiting, no dysuria, no congestion, no ear pain, no headaches, no plugged ear sensation, no rhinorrhea, no sinus pain, no sneezing, no sore throat, no swollen glands, no joint pain, no joint swelling, no neck pain, no cough, no rash and no wheezing. Treatments tried: took [place] powders last week with improvement in symptoms but not appetite. The treatment provided no relief.  Has HIV on treatment, sees a clinic in [place].  He's had no f/c/r.  No rashes on the skin no cough just poor appetite for 2 weeks.  States he thought he had the flu 2 weeks ago which got better (though he cannot elaborate on what symptoms he had with this) and still has a poor appetite.  When asked what has changed in the past 24 hours he states he feesl some better and is drinking OJ and eating apples and tangerines.  He cannot give any additional symptoms at this time   Past Medical History:  Diagnosis Date  . HIV (human immunodeficiency virus infection) (HCC)   . Hypertension     There are no active problems to display for this patient.   Past Surgical History:  Procedure Laterality Date  . FRACTURE SURGERY         Home Medications    Prior to Admission medications   Medication Sig Start Date End Date Taking? Authorizing Provider  cephALEXin (KEFLEX) 500 MG capsule Take 1 capsule (500 mg total) by mouth 4 (four) times daily. 01/24/15   Nicole Pisciotta, PA-C  gabapentin (NEURONTIN) 100 MG capsule Take  100 mg by mouth 3 (three) times daily.    Historical Provider, MD  hydrochlorothiazide (HYDRODIURIL) 12.5 MG tablet Take 12.5 mg by mouth daily.    Historical Provider, MD  lisinopril (PRINIVIL,ZESTRIL) 10 MG tablet Take 10 mg by mouth daily.    Historical Provider, MD  oxyCODONE-acetaminophen (PERCOCET/ROXICET) 5-325 MG per tablet 1 to 2 tabs PO q6hrs  PRN for pain 01/26/15   Rolan Bucco, MD  sulfamethoxazole-trimethoprim (BACTRIM DS) 800-160 MG per tablet Take 1 tablet by mouth 2 (two) times daily. 01/24/15   Wynetta Emery, PA-C    Family History History reviewed. No pertinent family history.  Social History Social History  Substance Use Topics  . Smoking status: Never Smoker  . Smokeless tobacco: Never Used  . Alcohol use Yes     Comment: Pt states no alcohol for approximaly one month     Allergies   Review of patient's allergies indicates no known allergies.   Review of Systems Review of Systems  Constitutional: Positive for appetite change. Negative for activity change, chills, diaphoresis, fatigue, fever and unexpected weight change.  HENT: Negative for congestion, ear pain, facial swelling, rhinorrhea, sneezing, sore throat, trouble swallowing and voice change.   Eyes: Negative for photophobia, redness and visual disturbance.  Respiratory: Negative for cough and wheezing.   Cardiovascular: Negative for chest pain.  Gastrointestinal: Negative for abdominal pain, diarrhea,  nausea and vomiting.  Genitourinary: Positive for flank pain. Negative for dysuria.       Stated this post admission  Musculoskeletal: Negative for arthralgias, joint pain, myalgias, neck pain and neck stiffness.  Skin: Negative for rash and wound.  Neurological: Negative for dizziness, syncope, facial asymmetry and headaches.  Hematological: Negative for adenopathy.  All other systems reviewed and are negative.    Physical Exam Updated Vital Signs BP 141/88 (BP Location: Right Arm)   Pulse 103    Temp 98.8 F (37.1 C) (Oral)   Resp 18   Ht 6\' 3"  (1.905 m)   Wt 250 lb (113.4 kg)   SpO2 100%   BMI 31.25 kg/m   Physical Exam  Constitutional: He is oriented to person, place, and time. He appears well-developed and well-nourished.  HENT:  Head: Normocephalic and atraumatic.  Nose: Nose normal.  Mouth/Throat: No oropharyngeal exudate.  Eyes: Conjunctivae and EOM are normal. Pupils are equal, round, and reactive to light.  Neck: Normal range of motion. Neck supple. No JVD present. No tracheal deviation present.  Cardiovascular: Regular rhythm, normal heart sounds and intact distal pulses.  Tachycardia present.   Pulmonary/Chest: Effort normal and breath sounds normal. No stridor. No respiratory distress. He has no wheezes. He has no rales.  Abdominal: Soft. Bowel sounds are normal. He exhibits no mass. There is no tenderness. There is no rebound and no guarding.  Musculoskeletal: Normal range of motion. He exhibits no edema or deformity.  Lymphadenopathy:    He has cervical adenopathy.  Neurological: He is alert and oriented to person, place, and time. He has normal reflexes.  Skin: Skin is warm and dry. Capillary refill takes less than 2 seconds. He is not diaphoretic.  Psychiatric: His mood appears anxious. His speech is not rapid and/or pressured. Thought content is not delusional.  Avoids eye contact with examiner     ED Treatments / Results   Vitals:   09/29/16 1319 09/29/16 2104  BP: (!) 132/98 121/86  Pulse:  91  Resp:  19  Temp: 98.8 F (37.1 C) 97.4 F (36.3 C)   Results for orders placed or performed during the hospital encounter of 09/29/16  Culture, blood (Routine X 2) w Reflex to ID Panel  Result Value Ref Range   Specimen Description BLOOD LEFT ARM    Special Requests BOTTLES DRAWN AEROBIC AND ANAEROBIC 5CC EACH    Culture      NO GROWTH < 12 HOURS Performed at [facility]    Report Status PENDING   Culture, blood (Routine X 2) w Reflex to  ID Panel  Result Value Ref Range   Specimen Description BLOOD LEFT HAND    Special Requests BOTTLES DRAWN AEROBIC AND ANAEROBIC 5CC EACH    Culture      NO GROWTH < 12 HOURS Performed at [facility]    Report Status PENDING   CBC with Differential/Platelet  Result Value Ref Range   WBC 9.8 4.0 - 10.5 K/uL   RBC 4.89 4.22 - 5.81 MIL/uL   Hemoglobin 15.3 13.0 - 17.0 g/dL   HCT 82.943.7 56.239.0 - 13.052.0 %   MCV 89.4 78.0 - 100.0 fL   MCH 31.3 26.0 - 34.0 pg   MCHC 35.0 30.0 - 36.0 g/dL   RDW 86.511.7 78.411.5 - 69.615.5 %   Platelets 231 150 - 400 K/uL   Neutrophils Relative % 15 %   Neutro Abs 1.5 (L) 1.7 - 7.7 K/uL   Lymphocytes Relative  66 %   Lymphs Abs 6.5 (H) 0.7 - 4.0 K/uL   Monocytes Relative 18 %   Monocytes Absolute 1.7 (H) 0.1 - 1.0 K/uL   Eosinophils Relative 0 %   Eosinophils Absolute 0.0 0.0 - 0.7 K/uL   Basophils Relative 0 %   Basophils Absolute 0.0 0.0 - 0.1 K/uL   WBC Morphology ATYPICAL LYMPHOCYTES   Comprehensive metabolic panel  Result Value Ref Range   Sodium 134 (L) 135 - 145 mmol/L   Potassium 3.9 3.5 - 5.1 mmol/L   Chloride 102 101 - 111 mmol/L   CO2 25 22 - 32 mmol/L   Glucose, Bld 107 (H) 65 - 99 mg/dL   BUN 12 6 - 20 mg/dL   Creatinine, Ser 1.611.08 0.61 - 1.24 mg/dL   Calcium 8.8 (L) 8.9 - 10.3 mg/dL   Total Protein 7.5 6.5 - 8.1 g/dL   Albumin 3.6 3.5 - 5.0 g/dL   AST 096171 (H) 15 - 41 U/L   ALT 184 (H) 17 - 63 U/L   Alkaline Phosphatase 103 38 - 126 U/L   Total Bilirubin 0.8 0.3 - 1.2 mg/dL   GFR calc non Af Amer >60 >60 mL/min   GFR calc Af Amer >60 >60 mL/min   Anion gap 7 5 - 15  Urinalysis, Routine w reflex microscopic (not at [facility])  Result Value Ref Range   Color, Urine ORANGE (A) YELLOW   APPearance CLOUDY (A) CLEAR   Specific Gravity, Urine 1.028 1.005 - 1.030   pH 5.5 5.0 - 8.0   Glucose, UA NEGATIVE NEGATIVE mg/dL   Hgb urine dipstick NEGATIVE NEGATIVE   Bilirubin Urine SMALL (A) NEGATIVE   Ketones, ur 15 (A) NEGATIVE mg/dL   Protein, ur  30 (A) NEGATIVE mg/dL   Nitrite NEGATIVE NEGATIVE   Leukocytes, UA SMALL (A) NEGATIVE  Rapid urine drug screen (hospital performed)  Result Value Ref Range   Opiates NONE DETECTED NONE DETECTED   Cocaine NONE DETECTED NONE DETECTED   Benzodiazepines NONE DETECTED NONE DETECTED   Amphetamines NONE DETECTED NONE DETECTED   Tetrahydrocannabinol POSITIVE (A) NONE DETECTED   Barbiturates NONE DETECTED NONE DETECTED  Acetaminophen level  Result Value Ref Range   Acetaminophen (Tylenol), Serum <10 (L) 10 - 30 ug/mL  Salicylate level  Result Value Ref Range   Salicylate Lvl <4.0 2.8 - 30.0 mg/dL  Urine microscopic-add on  Result Value Ref Range   Squamous Epithelial / LPF 0-5 (A) NONE SEEN   WBC, UA 0-5 0 - 5 WBC/hpf   RBC / HPF 0-5 0 - 5 RBC/hpf   Bacteria, UA MANY (A) NONE SEEN   Crystals CA OXALATE CRYSTALS (A) NEGATIVE   Urine-Other MUCOUS PRESENT   Ammonia  Result Value Ref Range   Ammonia 31 9 - 35 umol/L  Pathologist smear review  Result Value Ref Range   Path Review ABSOLUTE LYMPHOCYTOSIS   Troponin I  Result Value Ref Range   Troponin I <0.03 <0.03 ng/mL  Troponin I  Result Value Ref Range   Troponin I 0.03 (HH) <0.03 ng/mL  Lipase, blood  Result Value Ref Range   Lipase 21 11 - 51 U/L  I-Stat CG4 Lactic Acid, ED  Result Value Ref Range   Lactic Acid, Venous 0.85 0.5 - 1.9 mmol/L   Dg Chest 2 View  Result Date: 09/29/2016 CLINICAL DATA:  Chest pressure and cough. EXAM: CHEST  2 VIEW COMPARISON:  None. FINDINGS: The lungs are clear. The pulmonary vasculature is normal.  Heart size is normal. Hilar and mediastinal contours are unremarkable. There is no pleural effusion. IMPRESSION: No active cardiopulmonary disease. Electronically Signed   By: Ellery Plunk M.D.   On: 09/29/2016 06:26     Procedures Procedures (including critical care time)  Medications Ordered in ED Medications  aztreonam (AZACTAM) 1 g injection (not administered)  lisinopril  (PRINIVIL,ZESTRIL) tablet 10 mg (not administered)  sodium chloride flush (NS) 0.9 % injection 3 mL (3 mLs Intravenous Given 09/29/16 2200)  levofloxacin (LEVAQUIN) IVPB 750 mg (750 mg Intravenous Given 09/29/16 1722)  ondansetron (ZOFRAN) injection 4 mg (not administered)  0.9 %  sodium chloride infusion ( Intravenous New Bag/Given 09/29/16 1451)  enoxaparin (LOVENOX) injection 40 mg (40 mg Subcutaneous Not Given 09/29/16 1723)  acetaminophen (TYLENOL) tablet 650 mg (650 mg Oral Given 09/29/16 1828)  sodium chloride 0.9 % bolus 1,000 mL (0 mLs Intravenous Stopped 09/29/16 0715)  sodium chloride 0.9 % bolus 1,000 mL (0 mLs Intravenous Stopped 09/29/16 0820)  sodium chloride 0.9 % bolus 1,000 mL (0 mLs Intravenous Stopped 09/29/16 0810)  aztreonam (AZACTAM) 1 g in dextrose 5 % 50 mL IVPB (0 g Intravenous Stopped 09/29/16 0830)  vancomycin (VANCOCIN) IVPB 1000 mg/200 mL premix (0 mg Intravenous Stopped 09/29/16 0855)      Final Clinical Impressions(s) / ED Diagnoses  Pyelonephritis: will admit to hospitalist Dr. York Spaniel   Cy Blamer, MD 09/29/16 2352

## 2016-09-29 NOTE — Progress Notes (Signed)
CRITICAL VALUE ALERT  Critical value received:  Troponin 0.03  Date of notification:  09/29/16  Time of notification:  2040 Critical value read back:Yes.    Nurse who received alert:  Christean GriefIvana Annaliah Rivenbark  MD notified (1st page):  Craige CottaKirby  Time of first page:  2041  MD notified (2nd page):  Time of second page: 2057  Responding MD:  Craige CottaKirby  Time MD responded:  2100

## 2016-09-29 NOTE — H&P (Signed)
[facility] History and Physical  Kyle Barnes WUJ:[id number] DOB: 11/18/1971 DOA: 09/29/2016  Referring physician:  PCP: No PCP Per Patient  Specialists:   Chief Complaint: weakness, poor appetite, chills   HPI: Kyle Barnes is a 45 y.o. male with PMH of HIV, HTN, alcohol use, not taking any meds for several weeks presented with generalized weakness, poor appetite, chills. Patient reports recent history of upper respiratory symptoms two weeks ago. Since, he felt weak, nauseated with intermittent epigastric abdominal cramps, dysuria, and subjective fevers. He denies vomiting, no diarrhea, no acute SOB, no cough. Denies acute chest pain, but reports muscle pains due to physical work. [facility], does not remember his recent CD4 or VL. He was not taking meds for several weeks. He also reports recent few episodes of skins abscesses  -ED: noted to have UTI, ? Pyelonephritis. also abnormal LFTs. hospitalist is called for admission   Review of Systems: The patient denies anorexia, fever, weight loss,, vision loss, decreased hearing, hoarseness, chest pain, syncope, dyspnea on exertion, peripheral edema, balance deficits, hemoptysis, abdominal pain, melena, hematochezia, severe indigestion/heartburn, hematuria, incontinence, genital sores, muscle weakness, suspicious skin lesions, transient blindness, difficulty walking, depression, unusual weight change, abnormal bleeding, enlarged lymph nodes, angioedema, and breast masses.    Past Medical History:  Diagnosis Date  . Carpal tunnel syndrome   . HIV (human immunodeficiency virus infection) (HCC)   . Hypertension   . Pyelonephritis 09/29/2016   Past Surgical History:  Procedure Laterality Date  . FRACTURE SURGERY     Social History:  reports that he has never smoked. He has never used smokeless tobacco. He reports that he drinks alcohol. He reports that he uses drugs, including Marijuana. Home;  where does  patient live--home, ALF, SNF? and with whom if at home? Yes;  Can patient participate in ADLs?  No Known Allergies  History reviewed. No pertinent family history.  (be sure to complete)  Prior to Admission medications   Medication Sig Start Date End Date Taking? Authorizing Provider  cephALEXin (KEFLEX) 500 MG capsule Take 1 capsule (500 mg total) by mouth 4 (four) times daily. 01/24/15   Nicole Pisciotta, PA-C  gabapentin (NEURONTIN) 100 MG capsule Take 100 mg by mouth 3 (three) times daily.    Historical Provider, MD  hydrochlorothiazide (HYDRODIURIL) 12.5 MG tablet Take 12.5 mg by mouth daily.    Historical Provider, MD  lisinopril (PRINIVIL,ZESTRIL) 10 MG tablet Take 10 mg by mouth daily.    Historical Provider, MD  oxyCODONE-acetaminophen (PERCOCET/ROXICET) 5-325 MG per tablet 1 to 2 tabs PO q6hrs  PRN for pain 01/26/15   Rolan BuccoMelanie Belfi, MD  sulfamethoxazole-trimethoprim (BACTRIM DS) 800-160 MG per tablet Take 1 tablet by mouth 2 (two) times daily. 01/24/15   Wynetta EmeryNicole Pisciotta, PA-C   Physical Exam: Vitals:   09/29/16 1100 09/29/16 1319  BP: 123/86 (!) 132/98  Pulse: 78   Resp:    Temp:  98.8 F (37.1 C)     General:  Alert, no distress   Eyes: eom-i  ENT: no oral ulcers   Neck: supple   Cardiovascular: s1,s2 rrr  Respiratory: CTA BL   Abdomen: soft, nt, nd   Skin: no rash   Musculoskeletal: no leg edema   Neurologic: CN 2-12 intact. Motor 5/5 BL   Labs on Admission:  Basic Metabolic Panel:  Recent Labs Lab 09/29/16 0613  NA 134*  K 3.9  CL 102  CO2 25  GLUCOSE 107*  BUN 12  CREATININE  1.08  CALCIUM 8.8*   Liver Function Tests:  Recent Labs Lab 09/29/16 0613  AST 171*  ALT 184*  ALKPHOS 103  BILITOT 0.8  PROT 7.5  ALBUMIN 3.6   No results for input(s): LIPASE, AMYLASE in the last 168 hours.  Recent Labs Lab 09/29/16 0740  AMMONIA 31   CBC:  Recent Labs Lab 09/29/16 0613  WBC 9.8  NEUTROABS 1.5*  HGB 15.3  HCT 43.7  MCV 89.4   PLT 231   Cardiac Enzymes: No results for input(s): CKTOTAL, CKMB, CKMBINDEX, TROPONINI in the last 168 hours.  BNP (last 3 results) No results for input(s): BNP in the last 8760 hours.  ProBNP (last 3 results) No results for input(s): PROBNP in the last 8760 hours.  CBG: No results for input(s): GLUCAP in the last 168 hours.  Radiological Exams on Admission: Dg Chest 2 View  Result Date: 09/29/2016 CLINICAL DATA:  Chest pressure and cough. EXAM: CHEST  2 VIEW COMPARISON:  None. FINDINGS: The lungs are clear. The pulmonary vasculature is normal. Heart size is normal. Hilar and mediastinal contours are unremarkable. There is no pleural effusion. IMPRESSION: No active cardiopulmonary disease. Electronically Signed   By: Ellery Plunkaniel R Mitchell M.D.   On: 09/29/2016 06:26    EKG: Independently reviewed.   Assessment/Plan Active Problems:   Pyelonephritis  45 y.o. male with PMH of HIV, HTN, alcohol use, not taking any meds for several weeks presented with generalized weakness, poor appetite, chills. Found to have urinary tract infection, questionable pyelonephritis   UTI. Questionable pyelonephritis. He received iv antibiotic (aztreonam/vanc in [place] [facility]). We will cont levofloxacin ([facility] allergy). Check renal US, gonorrhea, chlamydia   Abdominal discomfort. Exam is unremarkable. Abnormal LFTs, bili-normal on admission. We will obtain abdominal US, check lipase. Stop etoh   Recent URI. Lung exam is unremarkable, chest x ray: no clear infiltrates. Patient reports non specific chest discomfort. We will check ecg, trop   HIV. Non compliance. Check cd4 count. VL    None. if consultant consulted, please document name and whether formally or informally consulted  Code Status: full (must indicate code status--if unknown or must be presumed, indicate so) Family Communication: d/w patient, RN (indicate person spoken with, if applicable, with phone number if by telephone) Disposition Plan:  home 24-48 hrs  (indicate anticipated LOS)  Time spent: >45 minutes   Esperanza SheetsBURIEV, Yazeed Pryer N [facility] Pager [phone]  If 7PM-7AM, please contact night-coverage [email or web address] Password [email or web address] 09/29/2016, 2:16 PM

## 2016-09-29 NOTE — ED Notes (Signed)
Report to [facility] at bedside, pt care transferred to [facility] stafff.

## 2016-09-29 NOTE — ED Triage Notes (Addendum)
Pt states that he feels like he has the flu that started about a week ago, and he was taking Alka Seltzer Cold and Sinus and it helped with the symptoms; but he switched to Nyquill this past Wednesday evening and sometime during the night patient states he had a hallucinatory episode where he thought he was talking to his wife, but a later conversation with his wife revealed that he was talking to himself; pt also states that he has lost his appetite since all of this began. Patient states having a tangerine and an apple with a cup of water with couple of cups of orange juice all day yesterday; prior to that he had been drinking a lot of sodas, but still was only able to eat a few bites off of his plate; patient states he normally has a very healthy appetite.  Pt complains of stomach cramps. Pt denies shortness of breath, dizziness, nausea/vomiting or any unusual bleeding.

## 2016-09-29 NOTE — ED Notes (Signed)
Pt assisted to complete medical records release form.

## 2016-09-30 ENCOUNTER — Observation Stay (HOSPITAL_COMMUNITY): Payer: Self-pay

## 2016-09-30 DIAGNOSIS — N3000 Acute cystitis without hematuria: Secondary | ICD-10-CM

## 2016-09-30 DIAGNOSIS — N12 Tubulo-interstitial nephritis, not specified as acute or chronic: Secondary | ICD-10-CM

## 2016-09-30 LAB — COMPREHENSIVE METABOLIC PANEL
ALBUMIN: 2.7 g/dL — AB (ref 3.5–5.0)
ALK PHOS: 91 U/L (ref 38–126)
ALT: 235 U/L — ABNORMAL HIGH (ref 17–63)
AST: 249 U/L — AB (ref 15–41)
Anion gap: 6 (ref 5–15)
BILIRUBIN TOTAL: 0.8 mg/dL (ref 0.3–1.2)
BUN: 12 mg/dL (ref 6–20)
CALCIUM: 8 mg/dL — AB (ref 8.9–10.3)
CO2: 23 mmol/L (ref 22–32)
Chloride: 105 mmol/L (ref 101–111)
Creatinine, Ser: 0.93 mg/dL (ref 0.61–1.24)
GFR calc Af Amer: >60 mL/min (ref >60)
GLUCOSE: 80 mg/dL (ref 65–99)
Potassium: 4.4 mmol/L (ref 3.5–5.1)
Sodium: 134 mmol/L — ABNORMAL LOW (ref 135–145)
TOTAL PROTEIN: 5.7 g/dL — AB (ref 6.5–8.1)

## 2016-09-30 LAB — URINE CULTURE: CULTURE: NO GROWTH

## 2016-09-30 LAB — HEMOGLOBIN AND HEMATOCRIT, BLOOD
HCT: 37.9 % — ABNORMAL LOW (ref 39.0–52.0)
Hemoglobin: 12.8 g/dL — ABNORMAL LOW (ref 13.0–17.0)

## 2016-09-30 LAB — HIV-1 RNA QUANT-NO REFLEX-BLD: LOG10 HIV-1 RNA: UNDETERMINED log10copy/mL

## 2016-09-30 LAB — TROPONIN I: Troponin I: 0.04 ng/mL (ref <0.03)

## 2016-09-30 LAB — RPR: RPR Ser Ql: NONREACTIVE

## 2016-09-30 NOTE — Progress Notes (Signed)
At 0040, pt reported bright red blood with BM, denied pain and discomfort associated with BM. Doctor Craige CottaKirby notified at [phone].

## 2016-09-30 NOTE — Progress Notes (Addendum)
Craige CottaKirby, NP ordered H&H fallowing blood in stool earlier. Konrad DoloresMerrell, MD notified about results at 0330. Instructions given to monitor pt, and CBC will be repeated in AM.

## 2016-09-30 NOTE — Progress Notes (Signed)
Tropinin 0.04, AST 249, ALT 235. Doctor Konrad DoloresMerrell notified at [phone]. Page acknowledged by doctor Konrad DoloresMerrell, no new orders at this time.

## 2016-09-30 NOTE — Discharge Summary (Signed)
Physician Discharge Summary  Kyle ConradJohn Tworek ZOX:[id number] DOB: 03/21/1971 DOA: 09/29/2016  PCP: No PCP Per Patient  Admit date: 09/29/2016 Discharge date: 09/30/2016  Time spent: 30 minutes  Recommendations for Outpatient Follow-up:  No recommendations, patient left against medical advice. He was counseled on the need to stay in the hospital.  Patient opted to leave AMA and signed out.  Discharge Diagnoses:  Urinary tract infection, possible pyelonephritis Abdominal discomfort/Elevated LFTs Rectal bleeding HIV Recent URI Polysubstance abuse  Elevated troponin   Discharge Condition: Unstable  Diet recommendation: none  Filed Weights   09/29/16 0526 09/29/16 0534 09/29/16 1319  Weight: 113.4 kg (250 lb) 113.4 kg (250 lb) 111.4 kg (245 lb 11.2 oz)    History of present illness:  On 09/29/2016 by Dr. Jonette MateUlugbek Barnes Kyle Barnes is a 45 y.o. male with PMH of HIV, HTN, alcohol use, not taking any meds for several weeks presented with generalized weakness, poor appetite, chills. Patient reports recent history of upper respiratory symptoms two weeks ago. Since, he felt weak, nauseated with intermittent epigastric abdominal cramps, dysuria, and subjective fevers. He denies vomiting, no diarrhea, no acute SOB, no cough. Denies acute chest pain, but reports muscle pains due to physical work. He follows at [facility] for his [facility], does not remember his recent CD4 or VL. He was not taking meds for several weeks. He also reports recent few episodes of skins abscesses  -ED: noted to have UTI, ? Pyelonephritis. also abnormal LFTs. hospitalist is called for admission   Hospital Course:  Urinary tract infection, possible pyelonephritis -Patient received antibiotics, aztreonam and vancomycin at [facility] department -He was placed on Levaquin and due to penicillin allergy -Blood and urine cultures show no growth (?possibly as patient received antibiotics)  Abdominal discomfort/Elevated  LFTs -Abdominal ultrasound: No acute findings, normal gallbladder and no bile duct dilatation. 2 hyperechoic liver masses/lesions, largest at the dome of the right lobe, may reflect hemangiomas, liver MRI recommended for follow-up -Lipase, Alkaline phosphatase, ammonia within normal limits -LFTs continue to rise -Recommend cessation of alcohol use -RPR nonreactive   Rectal bleeding -Likely hemorrhoidal -Overnight, patient did have a bowel movement. He denies straining but did notice blood. -Hemoglobin 12.8  HIV -Appears to be noncompliant with medications -Patient follows in [place], does not remember his recent CD4 counts or viral load   Recent URI -Lung exam is unremarkable -Chest x-ray unremarkable for infection  Polysubstance abuse -Discussed alcohol, tobacco, drug use -Patient denied using marijuana 3 times. Explained to him the tox screen was positive for THC. Patient states it was secondhand.  Elevated troponin  -Noted to be 0.03 and 0.04  -Possibly related to the above  -Patient left AGAINST MEDICAL ADVICE before more workup could be done   Procedures: Abdomen US  Consultations: None  Discharge Exam: Vitals:   09/29/16 2104 09/30/16 0504  BP: 121/86 (!) 151/86  Pulse: 91 98  Resp: 19 19  Temp: 97.4 F (36.3 C) 99.6 F (37.6 C)   No physical exam on discharge- patient left against medical advice.  Discharge Instructions  Current Discharge Medication List    CONTINUE these medications which have NOT CHANGED   Details  gabapentin (NEURONTIN) 100 MG capsule Take 100 mg by mouth 3 (three) times daily.    hydrochlorothiazide (HYDRODIURIL) 12.5 MG tablet Take 12.5 mg by mouth daily.    lisinopril (PRINIVIL,ZESTRIL) 10 MG tablet Take 10 mg by mouth daily.    cephALEXin (KEFLEX) 500 MG capsule Take 1 capsule (500 mg total)  by mouth 4 (four) times daily. Qty: 20 capsule, Refills: 0    oxyCODONE-acetaminophen (PERCOCET/ROXICET) 5-325 MG per tablet 1 to 2  tabs PO q6hrs  PRN for pain Qty: 15 tablet, Refills: 0    sulfamethoxazole-trimethoprim (BACTRIM DS) 800-160 MG per tablet Take 1 tablet by mouth 2 (two) times daily. Qty: 20 tablet, Refills: 0       No Known Allergies    The results of significant diagnostics from this hospitalization (including imaging, microbiology, ancillary and laboratory) are listed below for reference.    Significant Diagnostic Studies: Dg Chest 2 View  Result Date: 09/29/2016 CLINICAL DATA:  Chest pressure and cough. EXAM: CHEST  2 VIEW COMPARISON:  None. FINDINGS: The lungs are clear. The pulmonary vasculature is normal. Heart size is normal. Hilar and mediastinal contours are unremarkable. There is no pleural effusion. IMPRESSION: No active cardiopulmonary disease. Electronically Signed   By: Ellery Plunk M.D.   On: 09/29/2016 06:26   US Abdomen Complete  Result Date: 09/30/2016 CLINICAL DATA:  Abdominal pain for 2 weeks. EXAM: ABDOMEN ULTRASOUND COMPLETE COMPARISON:  None. FINDINGS: Gallbladder: No gallstones or wall thickening visualized. No sonographic Murphy sign noted by sonographer. Common bile duct: Diameter: 6 mm Liver: There is a focal area of increased echogenicity at the dome of the right lobe of the liver measuring 4.1 x 5.7 x 4.1 cm. There is a small are similarly hyperechoic lesion in the left liver lobe measuring 2 cm. No other liver masses or lesions. Liver is normal in size. Hepatopetal flow documented in the portal vein. IVC: No abnormality visualized. Pancreas: Obscured by been diagnosed bowel gas. Spleen: Size and appearance within normal limits. Right Kidney: Length: 12.1 cm. Echogenicity within normal limits. No mass or hydronephrosis visualized. Left Kidney: Length: 13.2 cm. Echogenicity within normal limits. No mass or hydronephrosis visualized. Abdominal aorta: No aneurysm visualized. Other findings: None. IMPRESSION: 1. No acute findings.  Normal gallbladder.  No bile duct dilation. 2. 2  hyperechoic liver masses/lesions, largest at the dome of the right lobe measuring 5.7 cm in greatest dimension. These may reflect hemangiomas. Recommend follow-up liver MRI with and without contrast for further assessment and characterization. 3. No other abnormalities. Pancreas not visualized, obscured by midline bowel gas. Electronically Signed   By: Amie Portland M.D.   On: 09/30/2016 09:11    Microbiology: Recent Results (from the past 240 hour(s))  Urine culture     Status: None   Collection Time: 09/29/16  5:58 AM  Result Value Ref Range Status   Specimen Description URINE, RANDOM  Final   Special Requests NONE  Final   Culture NO GROWTH Performed at [facility]   Final   Report Status 09/30/2016 FINAL  Final  Culture, blood (Routine X 2) w Reflex to ID Panel     Status: None (Preliminary result)   Collection Time: 09/29/16  7:30 AM  Result Value Ref Range Status   Specimen Description BLOOD LEFT ARM  Final   Special Requests BOTTLES DRAWN AEROBIC AND ANAEROBIC 5CC EACH  Final   Culture   Final    NO GROWTH 1 DAY Performed at [facility]    Report Status PENDING  Incomplete  Culture, blood (Routine X 2) w Reflex to ID Panel     Status: None (Preliminary result)   Collection Time: 09/29/16  7:40 AM  Result Value Ref Range Status   Specimen Description BLOOD LEFT HAND  Final   Special Requests BOTTLES DRAWN AEROBIC AND  ANAEROBIC 5CC EACH  Final   Culture   Final    NO GROWTH 1 DAY Performed at [facility]    Report Status PENDING  Incomplete     Labs: Basic Metabolic Panel:  Recent Labs Lab 09/29/16 0613 09/30/16 0207  NA 134* 134*  K 3.9 4.4  CL 102 105  CO2 25 23  GLUCOSE 107* 80  BUN 12 12  CREATININE 1.08 0.93  CALCIUM 8.8* 8.0*   Liver Function Tests:  Recent Labs Lab 09/29/16 0613 09/30/16 0207  AST 171* 249*  ALT 184* 235*  ALKPHOS 103 91  BILITOT 0.8 0.8  PROT 7.5 5.7*  ALBUMIN 3.6 2.7*    Recent Labs Lab  09/29/16 1445  LIPASE 21    Recent Labs Lab 09/29/16 0740  AMMONIA 31   CBC:  Recent Labs Lab 09/29/16 0613 09/30/16 0207  WBC 9.8  --   NEUTROABS 1.5*  --   HGB 15.3 12.8*  HCT 43.7 37.9*  MCV 89.4  --   PLT 231  --    Cardiac Enzymes:  Recent Labs Lab 09/29/16 1445 09/29/16 1912 09/30/16 0207  TROPONINI <0.03 0.03* 0.04*   BNP: BNP (last 3 results) No results for input(s): BNP in the last 8760 hours.  ProBNP (last 3 results) No results for input(s): PROBNP in the last 8760 hours.  CBG: No results for input(s): GLUCAP in the last 168 hours.     SignedEdsel Petrin:  Cerrone Debold  [facility] 09/30/2016, 1:42 PM

## 2016-09-30 NOTE — Progress Notes (Signed)
Pt stated that he needed to leave now, he had an urgent situation involving his housing that could not wait. I explained to him the importance of staying per MD. He stated that he didn't want to leave but had to. MD notified. Pt signed AMA paper and left unit.

## 2016-10-01 ENCOUNTER — Encounter (HOSPITAL_COMMUNITY): Payer: Self-pay | Admitting: *Deleted

## 2016-10-01 ENCOUNTER — Emergency Department (HOSPITAL_COMMUNITY): Payer: Self-pay

## 2016-10-01 ENCOUNTER — Emergency Department (HOSPITAL_COMMUNITY)
Admission: EM | Admit: 2016-10-01 | Discharge: 2016-10-01 | Disposition: A | Discharge status: Home / Self Care | Payer: Self-pay | Attending: Emergency Medicine | Admitting: Emergency Medicine

## 2016-10-01 DIAGNOSIS — Y929 Unspecified place or not applicable: Secondary | ICD-10-CM | POA: Insufficient documentation

## 2016-10-01 DIAGNOSIS — I10 Essential (primary) hypertension: Secondary | ICD-10-CM | POA: Insufficient documentation

## 2016-10-01 DIAGNOSIS — S39012A Strain of muscle, fascia and tendon of lower back, initial encounter: Secondary | ICD-10-CM | POA: Insufficient documentation

## 2016-10-01 DIAGNOSIS — R7401 Elevation of levels of liver transaminase levels: Secondary | ICD-10-CM

## 2016-10-01 DIAGNOSIS — Y999 Unspecified external cause status: Secondary | ICD-10-CM | POA: Insufficient documentation

## 2016-10-01 DIAGNOSIS — Y939 Activity, unspecified: Secondary | ICD-10-CM | POA: Insufficient documentation

## 2016-10-01 DIAGNOSIS — R740 Nonspecific elevation of levels of transaminase and lactic acid dehydrogenase [LDH]: Secondary | ICD-10-CM | POA: Insufficient documentation

## 2016-10-01 DIAGNOSIS — Z79899 Other long term (current) drug therapy: Secondary | ICD-10-CM | POA: Insufficient documentation

## 2016-10-01 DIAGNOSIS — B20 Human immunodeficiency virus [HIV] disease: Secondary | ICD-10-CM | POA: Insufficient documentation

## 2016-10-01 DIAGNOSIS — X58XXXA Exposure to other specified factors, initial encounter: Secondary | ICD-10-CM | POA: Insufficient documentation

## 2016-10-01 DIAGNOSIS — R109 Unspecified abdominal pain: Secondary | ICD-10-CM | POA: Insufficient documentation

## 2016-10-01 LAB — URINALYSIS, ROUTINE W REFLEX MICROSCOPIC
GLUCOSE, UA: NEGATIVE mg/dL
Hgb urine dipstick: NEGATIVE
KETONES UR: 15 mg/dL — AB
LEUKOCYTES UA: NEGATIVE
NITRITE: NEGATIVE
PROTEIN: NEGATIVE mg/dL
Specific Gravity, Urine: 1.02 (ref 1.005–1.030)
pH: 6 (ref 5.0–8.0)

## 2016-10-01 LAB — CBC WITH DIFFERENTIAL/PLATELET
Basophils Absolute: 0.3 10*3/uL — ABNORMAL HIGH (ref 0.0–0.1)
Basophils Relative: 4 %
EOS PCT: 1 %
Eosinophils Absolute: 0.1 10*3/uL (ref 0.0–0.7)
HEMATOCRIT: 41.2 % (ref 39.0–52.0)
HEMOGLOBIN: 14.3 g/dL (ref 13.0–17.0)
LYMPHS ABS: 4.8 10*3/uL — AB (ref 0.7–4.0)
LYMPHS PCT: 68 %
MCH: 31.5 pg (ref 26.0–34.0)
MCHC: 34.7 g/dL (ref 30.0–36.0)
MCV: 90.7 fL (ref 78.0–100.0)
MONOS PCT: 11 %
Monocytes Absolute: 0.8 10*3/uL (ref 0.1–1.0)
NEUTROS PCT: 16 %
Neutro Abs: 1.2 10*3/uL — ABNORMAL LOW (ref 1.7–7.7)
Platelets: 228 10*3/uL (ref 150–400)
RBC: 4.54 MIL/uL (ref 4.22–5.81)
RDW: 12 % (ref 11.5–15.5)
WBC: 7.2 10*3/uL (ref 4.0–10.5)

## 2016-10-01 LAB — COMPREHENSIVE METABOLIC PANEL
ALT: 322 U/L — ABNORMAL HIGH (ref 17–63)
AST: 325 U/L — AB (ref 15–41)
Albumin: 3.4 g/dL — ABNORMAL LOW (ref 3.5–5.0)
Alkaline Phosphatase: 113 U/L (ref 38–126)
Anion gap: 8 (ref 5–15)
BUN: 10 mg/dL (ref 6–20)
CHLORIDE: 103 mmol/L (ref 101–111)
CO2: 22 mmol/L (ref 22–32)
Calcium: 8.7 mg/dL — ABNORMAL LOW (ref 8.9–10.3)
Creatinine, Ser: 1.06 mg/dL (ref 0.61–1.24)
GFR calc Af Amer: >60 mL/min (ref >60)
Glucose, Bld: 89 mg/dL (ref 65–99)
POTASSIUM: 4 mmol/L (ref 3.5–5.1)
Sodium: 133 mmol/L — ABNORMAL LOW (ref 135–145)
Total Bilirubin: 1 mg/dL (ref 0.3–1.2)
Total Protein: 6.9 g/dL (ref 6.5–8.1)

## 2016-10-01 LAB — LACTIC ACID, PLASMA: Lactic Acid, Venous: 1.5 mmol/L (ref 0.5–1.9)

## 2016-10-01 LAB — TROPONIN I: Troponin I: <0.03 ng/mL (ref <0.03)

## 2016-10-01 MED ORDER — IOPAMIDOL (ISOVUE-300) INJECTION 61%
INTRAVENOUS | Status: AC
  Administered 2016-10-01: 100 mL
  Filled 2016-10-01: qty 100

## 2016-10-01 MED ORDER — HYDROCODONE-ACETAMINOPHEN 5-325 MG PO TABS: 1.0000 | ORAL_TABLET | ORAL | 0 refills | Status: AC | PRN

## 2016-10-01 MED ORDER — SODIUM CHLORIDE 0.9 % IV BOLUS (SEPSIS)
1000.0000 mL | Freq: Once | INTRAVENOUS | Status: AC
  Administered 2016-10-01: 1000 mL via INTRAVENOUS

## 2016-10-01 MED ORDER — IBUPROFEN 400 MG PO TABS
600.0000 mg | ORAL_TABLET | Freq: Once | ORAL | Status: AC
  Administered 2016-10-01: 600 mg via ORAL
  Filled 2016-10-01: qty 1

## 2016-10-01 NOTE — ED Notes (Signed)
Dr. Particia NearingHaviland made aware pt's temp 100.5 oral at discharge.  New verbal order received for 600 mg ibuprofen.

## 2016-10-01 NOTE — Care Management Note (Signed)
Case Management Note  Patient Details  Name: Kyle ConradJohn Barnes MRN: [id number] Date of Birth: 11/19/1971  Subjective/Objective:  45 y.o. [place] from [place] who is seen here in ED following recent AMA from Hospital  After admitted for UTI. (fever, chills). No meds over the past few weeks as [place] usually [place] in [place]. CM has [facility] and sent message to Horald ChestnutCarmen Ramirez to follow this pt as he does have hx of 042. Pt aware of Open Clinic hours for Friday 0830 10/06/2016 and verbalizes agreement to attend.                 Action/Plan: No further CM needs at this time,    Expected Discharge Date:                  Expected Discharge Plan:     In-House Referral:  PCP / [facility]  Discharge planning Services  CM Consult, [facility]  Post Acute Care Choice:  NA Choice offered to:  Patient  DME Arranged:    DME Agency:     HH Arranged:    HH Agency:     Status of Service:  Completed, signed off  If discussed at [employer] Meetings, dates discussed:    Additional Comments:  Yvone NeuCrutchfield, [name], RN 10/01/2016, 11:39 AM

## 2016-10-01 NOTE — ED Notes (Signed)
ED Provider at bedside. 

## 2016-10-01 NOTE — ED Triage Notes (Signed)
Pt reports being admitted to hospital on 11/3 for kidney and liver infection but pt had to leave AMA due to emergency situation. Pt is back today for readmission. Pt still having lower back and intermittent abd pain. Denies fever or n/v.

## 2016-10-01 NOTE — ED Provider Notes (Signed)
[facility]-EMERGENCY DEPT Provider Note   CSN: [id number] Arrival date & time: 10/01/16  [id number]     History   Chief Complaint Chief Complaint  Patient presents with  . Back Pain  . Abdominal Pain    HPI Kyle Barnes is a 45 y.o. male.  Pt admitted on 11/3 for kidney and liver problems.  He had to leave AMA yesterday due to a housing situation.  He comes back today for readmission.  He has a hx of HIV, but does not have a doctor in [place].  He does not know any recent CD4 or viral load counts.  He c/o pain in his lower left back.  He has not had an appetite for 2 weeks.      Past Medical History:  Diagnosis Date  . Carpal tunnel syndrome   . HIV (human immunodeficiency virus infection) (HCC)   . Hypertension   . Pyelonephritis 09/29/2016    Patient Active Problem List   Diagnosis Date Noted  . Pyelonephritis 09/29/2016  . UTI (urinary tract infection) 09/29/2016    Past Surgical History:  Procedure Laterality Date  . FRACTURE SURGERY         Home Medications    Prior to Admission medications   Medication Sig Start Date End Date Taking? Authorizing Provider  elvitegravir-cobicistat-emtricitabine-tenofovir (GENVOYA) 150-150-200-10 MG TABS tablet Take 1 tablet by mouth daily with breakfast.   Yes Historical Provider, MD  gabapentin (NEURONTIN) 100 MG capsule Take 100 mg by mouth 3 (three) times daily.   Yes Historical Provider, MD  hydrochlorothiazide (HYDRODIURIL) 12.5 MG tablet Take 12.5 mg by mouth daily.   Yes Historical Provider, MD  lisinopril (PRINIVIL,ZESTRIL) 10 MG tablet Take 10 mg by mouth daily.   Yes Historical Provider, MD  cephALEXin (KEFLEX) 500 MG capsule Take 1 capsule (500 mg total) by mouth 4 (four) times daily. Patient not taking: Reported on 09/29/2016 01/24/15   Joni ReiningNicole Pisciotta, PA-C  HYDROcodone-acetaminophen (NORCO/VICODIN) 5-325 MG tablet Take 1 tablet by mouth every 4 (four) hours as needed. 10/01/16   Jacalyn LefevreJulie Haille Pardi, MD  oxyCODONE-acetaminophen  (PERCOCET/ROXICET) 5-325 MG per tablet 1 to 2 tabs PO q6hrs  PRN for pain Patient not taking: Reported on 09/29/2016 01/26/15   Rolan BuccoMelanie Belfi, MD  sulfamethoxazole-trimethoprim (BACTRIM DS) 800-160 MG per tablet Take 1 tablet by mouth 2 (two) times daily. Patient not taking: Reported on 09/29/2016 01/24/15   Wynetta EmeryNicole Pisciotta, PA-C    Family History History reviewed. No pertinent family history.  Social History Social History  Substance Use Topics  . Smoking status: Never Smoker  . Smokeless tobacco: Never Used  . Alcohol use Yes     Comment: Pt states no alcohol for approximaly one month     Allergies   Patient has no known allergies.   Review of Systems Review of Systems  Constitutional: Positive for appetite change and fatigue.  Musculoskeletal: Positive for back pain.  All other systems reviewed and are negative.    Physical Exam Updated Vital Signs BP 141/93   Pulse 82   Temp 98.6 F (37 C) (Oral)   Resp 14   Ht 6\' 3"  (1.905 m)   Wt 245 lb (111.1 kg)   SpO2 99%   BMI 30.62 kg/m   Physical Exam  Constitutional: He appears well-developed and well-nourished.  HENT:  Head: Normocephalic and atraumatic.  Right Ear: External ear normal.  Left Ear: External ear normal.  Nose: Nose normal.  Mouth/Throat: Oropharynx is clear and moist.  Eyes: Conjunctivae and EOM  are normal. Pupils are equal, round, and reactive to light.  Neck: Normal range of motion. Neck supple.  Cardiovascular: Normal heart sounds and intact distal pulses.  Tachycardia present.   Pulmonary/Chest: Effort normal and breath sounds normal.  Abdominal: Soft. Bowel sounds are normal.  Musculoskeletal:       Arms: Neurological: He is alert.  Skin: Skin is warm.  Psychiatric: He has a normal mood and affect. His behavior is normal. Judgment and thought content normal.  Nursing note and vitals reviewed.    ED Treatments / Results  Labs (all labs ordered are listed, but only abnormal results are  displayed) Labs Reviewed  COMPREHENSIVE METABOLIC PANEL - Abnormal; Notable for the following:       Result Value   Sodium 133 (*)    Calcium 8.7 (*)    Albumin 3.4 (*)    AST 325 (*)    ALT 322 (*)    All other components within normal limits  CBC WITH DIFFERENTIAL/PLATELET - Abnormal; Notable for the following:    Neutro Abs 1.2 (*)    Lymphs Abs 4.8 (*)    Basophils Absolute 0.3 (*)    All other components within normal limits  URINALYSIS, ROUTINE W REFLEX MICROSCOPIC (NOT AT [facility]) - Abnormal; Notable for the following:    APPearance HAZY (*)    Bilirubin Urine SMALL (*)    Ketones, ur 15 (*)    All other components within normal limits  URINE CULTURE  CULTURE, BLOOD (ROUTINE X 2)  CULTURE, BLOOD (ROUTINE X 2)  TROPONIN I  LACTIC ACID, PLASMA  LACTIC ACID, PLASMA  HEPATITIS PANEL, ACUTE    EKG  EKG Interpretation None       Radiology [place] Abdomen Complete  Result Date: 09/30/2016 CLINICAL DATA:  Abdominal pain for 2 weeks. EXAM: ABDOMEN ULTRASOUND COMPLETE COMPARISON:  None. FINDINGS: Gallbladder: No gallstones or wall thickening visualized. No sonographic Murphy sign noted by sonographer. Common bile duct: Diameter: 6 mm Liver: There is a focal area of increased echogenicity at the dome of the right lobe of the liver measuring 4.1 x 5.7 x 4.1 cm. There is a small are similarly hyperechoic lesion in the left liver lobe measuring 2 cm. No other liver masses or lesions. Liver is normal in size. Hepatopetal flow documented in the portal vein. IVC: No abnormality visualized. Pancreas: Obscured by been diagnosed bowel gas. Spleen: Size and appearance within normal limits. Right Kidney: Length: 12.1 cm. Echogenicity within normal limits. No mass or hydronephrosis visualized. Left Kidney: Length: 13.2 cm. Echogenicity within normal limits. No mass or hydronephrosis visualized. Abdominal aorta: No aneurysm visualized. Other findings: None. IMPRESSION: 1. No acute findings.  Normal  gallbladder.  No bile duct dilation. 2. 2 hyperechoic liver masses/lesions, largest at the dome of the right lobe measuring 5.7 cm in greatest dimension. These may reflect hemangiomas. Recommend follow-up liver MRI with and without contrast for further assessment and characterization. 3. No other abnormalities. Pancreas not visualized, obscured by midline bowel gas. Electronically Signed   By: Amie Portlandavid  Ormond M.D.   On: 09/30/2016 09:11   Ct Abdomen Pelvis W Contrast  Result Date: 10/01/2016 CLINICAL DATA:  Right-sided abdominal pain intermittently for 2 weeks, initial encounter EXAM: CT ABDOMEN AND PELVIS WITH CONTRAST TECHNIQUE: Multidetector CT imaging of the abdomen and pelvis was performed using the standard protocol following bolus administration of intravenous contrast. CONTRAST:  100mL ISOVUE-300 IOPAMIDOL (ISOVUE-300) INJECTION 61% COMPARISON:  Ultrasound from the previous day. FINDINGS: Lower chest: Small bilateral  pleural effusions are noted. No definitive infiltrate or pulmonary nodules are seen. Hepatobiliary: The liver is well visualized and demonstrates at least 2 areas of decreased attenuation which demonstrate increased echogenicity on prior ultrasound. The largest of these lies in the posterior aspect of the right lobe of the liver and demonstrates diffuse nodular peripheral enhancement and I so attenuation with the adjacent liver on delayed images consistent with a hemangioma. The second smaller lesion lies in the medial segment of the left lobe of the liver best seen on images 13 and 14 of series 201. It is not covered on the delayed images but again likely represents a hemangioma. The gallbladder is within normal limits. Pancreas: Unremarkable. No pancreatic ductal dilatation or surrounding inflammatory changes. Spleen: Normal in size without focal abnormality. Adrenals/Urinary Tract: Adrenal glands are unremarkable. Kidneys are normal, without renal calculi, focal lesion, or hydronephrosis.  Bladder is unremarkable. Stomach/Bowel: Stomach is within normal limits. Appendix appears normal. No evidence of bowel wall thickening, distention, or inflammatory changes. Vascular/Lymphatic: No significant vascular findings are present. No enlarged abdominal or pelvic lymph nodes. Reproductive: Prostate is unremarkable. Other: No abdominal wall hernia or abnormality. No abdominopelvic ascites. Musculoskeletal: No acute or significant osseous findings. IMPRESSION: Hypodense lesions within the liver as described consistent with hemangiomas. Small bilateral pleural effusions. No other focal abnormality is seen. Electronically Signed   By: Alcide Clever M.D.   On: 10/01/2016 09:48    Procedures Procedures (including critical care time)  Medications Ordered in ED Medications  sodium chloride 0.9 % bolus 1,000 mL (0 mLs Intravenous Stopped 10/01/16 1000)  iopamidol (ISOVUE-300) 61 % injection (100 mLs  Contrast Given 10/01/16 0914)     Initial Impression / Assessment and Plan / ED Course  I have reviewed the triage vital signs and the nursing notes.  Pertinent labs & imaging results that were available during my care of the patient were reviewed by me and considered in my medical decision making (see chart for details).  Clinical Course     At this point, pt does not meet criteria for admission.  I spoke with Dr. Julien Nordmann ([facility]) who agrees.  She does work at [facility] and is happy to see patient there and get pt set up with ID.  Pt's cultures have not grown out anything, so pt does not need any more abx.  Pt's back pain is in the low back and not in the kidney region and seems musculoskeletal.  No sign of pyelo on ct scan.  Pt will be d/w SW to get him an appt at [facility].  Pt seen by case management and will f/u with [facility] on Friday the 10th.  Pt will be given a rx for 10 lortab for his back pain.  No HIV meds until seen by pcp and ID.  CD4 counts pending.  Final Clinical Impressions(s) / ED Diagnoses    Final diagnoses:  Strain of lumbar region, initial encounter  HIV (human immunodeficiency virus infection) (HCC)  Transaminitis    New Prescriptions New Prescriptions   HYDROCODONE-ACETAMINOPHEN (NORCO/VICODIN) 5-325 MG TABLET    Take 1 tablet by mouth every 4 (four) hours as needed.     Jacalyn Lefevre, MD 10/01/16 1153

## 2016-10-01 NOTE — ED Notes (Signed)
Patient transported to CT 

## 2016-10-02 LAB — HEPATITIS PANEL, ACUTE
HCV AB: 0.3 {s_co_ratio} (ref 0.0–0.9)
HEP A IGM: NEGATIVE
HEP B C IGM: NEGATIVE
HEP B S AG: NEGATIVE

## 2016-10-02 LAB — GC/CHLAMYDIA PROBE AMP (~~LOC~~) NOT AT ARMC
Chlamydia: NEGATIVE
Neisseria Gonorrhea: NEGATIVE

## 2016-10-02 LAB — URINE CULTURE: Culture: NO GROWTH

## 2016-10-02 LAB — T-HELPER CELLS (CD4) COUNT (NOT AT ARMC)
CD4 % Helper T Cell: 10 % — ABNORMAL LOW (ref 33–55)
CD4 T Cell Abs: 500 /uL (ref 400–2700)

## 2016-10-04 LAB — CULTURE, BLOOD (ROUTINE X 2)
CULTURE: NO GROWTH
CULTURE: NO GROWTH

## 2016-10-06 LAB — CULTURE, BLOOD (ROUTINE X 2)
Culture: NO GROWTH
Culture: NO GROWTH

## 2017-10-15 ENCOUNTER — Other Ambulatory Visit: Payer: Self-pay

## 2017-10-15 ENCOUNTER — Emergency Department (HOSPITAL_BASED_OUTPATIENT_CLINIC_OR_DEPARTMENT_OTHER): Payer: Self-pay

## 2017-10-15 ENCOUNTER — Encounter (HOSPITAL_BASED_OUTPATIENT_CLINIC_OR_DEPARTMENT_OTHER): Payer: Self-pay | Admitting: Emergency Medicine

## 2017-10-15 ENCOUNTER — Emergency Department (HOSPITAL_BASED_OUTPATIENT_CLINIC_OR_DEPARTMENT_OTHER)
Admission: EM | Admit: 2017-10-15 | Discharge: 2017-10-15 | Disposition: A | Discharge status: Home / Self Care | Payer: Self-pay | Attending: Emergency Medicine | Admitting: Emergency Medicine

## 2017-10-15 DIAGNOSIS — R3911 Hesitancy of micturition: Secondary | ICD-10-CM

## 2017-10-15 DIAGNOSIS — R590 Localized enlarged lymph nodes: Secondary | ICD-10-CM

## 2017-10-15 DIAGNOSIS — Z91148 Patient's other noncompliance with medication regimen for other reason: Secondary | ICD-10-CM

## 2017-10-15 DIAGNOSIS — I10 Essential (primary) hypertension: Secondary | ICD-10-CM

## 2017-10-15 DIAGNOSIS — N400 Enlarged prostate without lower urinary tract symptoms: Secondary | ICD-10-CM

## 2017-10-15 DIAGNOSIS — B20 Human immunodeficiency virus [HIV] disease: Secondary | ICD-10-CM

## 2017-10-15 DIAGNOSIS — R911 Solitary pulmonary nodule: Secondary | ICD-10-CM

## 2017-10-15 DIAGNOSIS — Z79899 Other long term (current) drug therapy: Secondary | ICD-10-CM | POA: Insufficient documentation

## 2017-10-15 DIAGNOSIS — Z9114 Patient's other noncompliance with medication regimen: Secondary | ICD-10-CM | POA: Insufficient documentation

## 2017-10-15 LAB — CBC WITH DIFFERENTIAL/PLATELET
Basophils Absolute: 0 10*3/uL (ref 0.0–0.1)
Basophils Relative: 0 %
EOS ABS: 0.5 10*3/uL (ref 0.0–0.7)
EOS PCT: 8 %
HCT: 41 % (ref 39.0–52.0)
HEMOGLOBIN: 14.1 g/dL (ref 13.0–17.0)
LYMPHS ABS: 2.5 10*3/uL (ref 0.7–4.0)
Lymphocytes Relative: 45 %
MCH: 32.1 pg (ref 26.0–34.0)
MCHC: 34.4 g/dL (ref 30.0–36.0)
MCV: 93.4 fL (ref 78.0–100.0)
MONO ABS: 0.5 10*3/uL (ref 0.1–1.0)
MONOS PCT: 8 %
Neutro Abs: 2.2 10*3/uL (ref 1.7–7.7)
Neutrophils Relative %: 39 %
PLATELETS: 216 10*3/uL (ref 150–400)
RBC: 4.39 MIL/uL (ref 4.22–5.81)
RDW: 11.5 % (ref 11.5–15.5)
WBC: 5.7 10*3/uL (ref 4.0–10.5)

## 2017-10-15 LAB — URINALYSIS, ROUTINE W REFLEX MICROSCOPIC
BILIRUBIN URINE: NEGATIVE
GLUCOSE, UA: NEGATIVE mg/dL
HGB URINE DIPSTICK: NEGATIVE
Ketones, ur: NEGATIVE mg/dL
Leukocytes, UA: NEGATIVE
Nitrite: NEGATIVE
PH: 5.5 (ref 5.0–8.0)
Protein, ur: NEGATIVE mg/dL
SPECIFIC GRAVITY, URINE: 1.02 (ref 1.005–1.030)

## 2017-10-15 LAB — BASIC METABOLIC PANEL
Anion gap: 6 (ref 5–15)
BUN: 17 mg/dL (ref 6–20)
CHLORIDE: 106 mmol/L (ref 101–111)
CO2: 27 mmol/L (ref 22–32)
CREATININE: 0.99 mg/dL (ref 0.61–1.24)
Calcium: 8.8 mg/dL — ABNORMAL LOW (ref 8.9–10.3)
GFR calc Af Amer: >60 mL/min (ref >60)
GFR calc non Af Amer: >60 mL/min (ref >60)
Glucose, Bld: 95 mg/dL (ref 65–99)
Potassium: 3.9 mmol/L (ref 3.5–5.1)
SODIUM: 139 mmol/L (ref 135–145)

## 2017-10-15 NOTE — ED Notes (Signed)
Patient transported to CT 

## 2017-10-15 NOTE — Discharge Instructions (Addendum)
No evidence of urinary tract infection or kidney stone seen on evaluation here Your prostate is slightly enlarged Pulmonary nodule is noted that is unchanged from prior as well as lymph nodes in the groin that are unchanged from prior.  Please establish primary care and area and follow-up for these above abnormalities. Return here if your pain is worse, fever, or other worsening symptoms. Please take your blood pressure medicines and have your blood pressure rechecked this week

## 2017-10-15 NOTE — ED Notes (Signed)
ED Provider at bedside. 

## 2017-10-15 NOTE — ED Triage Notes (Signed)
"   I have been having trouble urinating for a couple weeks" Frequency in urination, left flank pain at times, for past month or so.

## 2017-10-15 NOTE — ED Provider Notes (Addendum)
[facility] EMERGENCY DEPARTMENT Provider Note   CSN: [id number] Arrival date & time: 10/15/17  2130     History   Chief Complaint Chief Complaint  Patient presents with  . Flank Pain    HPI Kyle Barnes is a 46 y.o. male.  HPI 46 year old man history of HIV, on chronic antiretroviral therapy, history of UTI presents today with some hesitancy of urination and frequency of urination with left flank pain intermittently for the past month.  He denies any fever or chills.  Pain is crampy in the low back and worsens with movement.  He has not had nausea or vomiting.  Denies any hematuria.  Reports taking all his medications as prescribed. Past Medical History:  Diagnosis Date  . Carpal tunnel syndrome   . HIV (human immunodeficiency virus infection) (HCC)   . Hypertension   . Pyelonephritis 09/29/2016    Patient Active Problem List   Diagnosis Date Noted  . Pyelonephritis 09/29/2016  . UTI (urinary tract infection) 09/29/2016    Past Surgical History:  Procedure Laterality Date  . FRACTURE SURGERY         Home Medications    Prior to Admission medications   Medication Sig Start Date End Date Taking? Authorizing Provider  elvitegravir-cobicistat-emtricitabine-tenofovir (GENVOYA) 150-150-200-10 MG TABS tablet Take 1 tablet by mouth daily with breakfast.   Yes [provider]  gabapentin (NEURONTIN) 100 MG capsule Take 100 mg by mouth 3 (three) times daily.   Yes [provider]  hydrochlorothiazide (HYDRODIURIL) 12.5 MG tablet Take 12.5 mg by mouth daily.   Yes [provider]  lisinopril (PRINIVIL,ZESTRIL) 10 MG tablet Take 10 mg by mouth daily.   Yes [provider]  cephALEXin (KEFLEX) 500 MG capsule Take 1 capsule (500 mg total) by mouth 4 (four) times daily. Patient not taking: Reported on 09/29/2016 01/24/15   Pisciotta, Joni Reining, PA-C  HYDROcodone-acetaminophen (NORCO/VICODIN) 5-325 MG tablet Take 1 tablet by mouth  every 4 (four) hours as needed. 10/01/16   Jacalyn Lefevre, MD  oxyCODONE-acetaminophen (PERCOCET/ROXICET) 5-325 MG per tablet 1 to 2 tabs PO q6hrs  PRN for pain Patient not taking: Reported on 09/29/2016 01/26/15   Rolan Bucco, MD  sulfamethoxazole-trimethoprim (BACTRIM DS) 800-160 MG per tablet Take 1 tablet by mouth 2 (two) times daily. Patient not taking: Reported on 09/29/2016 01/24/15   Pisciotta, Mardella Layman    Family History No family history on file.  Social History Social History   Tobacco Use  . Smoking status: Never Smoker  . Smokeless tobacco: Never Used  Substance Use Topics  . Alcohol use: Yes    Alcohol/week: 1.2 oz    Types: 2 Shots of liquor per week    Comment: daily  . Drug use: No     Allergies   Patient has no known allergies.   Review of Systems Review of Systems  All other systems reviewed and are negative.    Physical Exam Updated Vital Signs BP (!) 164/113 (BP Location: Right Arm)   Pulse 85   Temp 98.3 F (36.8 C) (Oral)   Resp 18   Ht 1.905 m (6\' 3" )   Wt 108.9 kg (240 lb)   SpO2 100%   BMI 30.00 kg/m   Physical Exam  Constitutional: He is oriented to person, place, and time. He appears well-developed.  HENT:  Head: Normocephalic and atraumatic.  Right Ear: External ear normal.  Left Ear: External ear normal.  Nose: Nose normal.  Eyes: EOM are normal.  Neck:  No tracheal deviation present.  Cardiovascular: Normal rate, regular rhythm and normal heart sounds.  Pulmonary/Chest: Effort normal.  Abdominal: Soft. Bowel sounds are normal. He exhibits no distension and no mass. There is no tenderness. There is no guarding.  Genitourinary:  Genitourinary Comments: CVA tenderness is noted  Musculoskeletal: Normal range of motion.  Back exam is visually normal with no tenderness to palpation  Neurological: He is alert and oriented to person, place, and time.  Skin: Skin is warm and dry.  Psychiatric: He has a normal mood and affect. His  behavior is normal.  Nursing note and vitals reviewed.    ED Treatments / Results  Labs (all labs ordered are listed, but only abnormal results are displayed) Labs Reviewed  BASIC METABOLIC PANEL - Abnormal; Notable for the following components:      Result Value   Calcium 8.8 (*)    All other components within normal limits  URINALYSIS, ROUTINE W REFLEX MICROSCOPIC  CBC WITH DIFFERENTIAL/PLATELET    EKG  EKG Interpretation None       Radiology Ct Renal Stone Study  Result Date: 10/15/2017 CLINICAL DATA:  Left flank pain and painful urination for 1 month. EXAM: CT ABDOMEN AND PELVIS WITHOUT CONTRAST TECHNIQUE: Multidetector CT imaging of the abdomen and pelvis was performed following the standard protocol without IV contrast. COMPARISON:  10/01/2016 FINDINGS: Lower chest: Punctate pleural-based nodule in the right middle lobe on sequence 3, image 13 is unchanged. No pleural effusions. Hepatobiliary: Stable 1.6 cm low-density structure in the left hepatic dome. Again noted is a 4.4 cm low-density structure at the posterior right hepatic dome. These structures were previously described as hemangiomas on a postcontrast examination. Normal appearance of the gallbladder. No biliary dilatation. Pancreas: Normal appearance of the pancreas without inflammation or duct dilatation. Spleen: Normal appearance of spleen without enlargement. Adrenals/Urinary Tract: Normal adrenal glands. Urinary bladder is unremarkable. Negative for kidney stones or hydronephrosis. Stable pelvic calcifications probably represent phleboliths. No suspicious renal lesions on this noncontrast examination. Stomach/Bowel: Stomach is within normal limits. Appendix appears normal. No evidence of bowel wall thickening, distention, or inflammatory changes. Vascular/Lymphatic: Vascular structures are unremarkable. Again noted are prominent bilateral inguinal lymph nodes but this is unchanged. Reproductive: Prostate is slightly  prominent for size measuring up to 5.3 cm in the transverse dimension but unchanged. Seminal vesicles are unremarkable. Other: Negative for free air.  No free fluid. Musculoskeletal: Degenerative facet disease in lower lumbar spine. IMPRESSION: No acute abnormality in the abdomen or pelvis. No CT findings to explain left flank pain. No significant change in the hepatic lesions. These have been previously described as hemangiomas. Electronically Signed   By: Richarda OverlieAdam  Henn M.D.   On: 10/15/2017 08:39    Procedures Procedures (including critical care time)  Medications Ordered in ED Medications - No data to display   Initial Impression / Assessment and Plan / ED Course  I have reviewed the triage vital signs and the nursing notes.  Pertinent labs & imaging results that were available during my care of the patient were reviewed by me and considered in my medical decision making (see chart for details).     Patient presents with some urinary tract infection symptoms.  However urine is clear here and CT reveals no signs of stone disease or other nephrologic abnormalities.  Labs are normal.  Back pain appears musculoskeletal in nature.  Patient advised regarding conservative therapy, need for close follow-up, and return precautions and voices understanding of plan.  Noted abnormalities  on CT reveal unchanged pulmonary nodule and unchanged inguinal adenopathy.  Patient advised regarding his hypertension and lack of compliance.  Is advised to restart his antihypertensive medications.  Final Clinical Impressions(s) / ED Diagnoses   Final diagnoses:  Urinary hesitancy  Enlarged prostate  Lung nodule  Inguinal adenopathy  HIV disease [facility](HCC)    ED Discharge Orders    None       Margarita Grizzleay, Shonte Soderlund, MD 10/15/17 [id number]    Margarita Grizzleay, Skyeler Smola, MD 10/15/17 [phone]

## 2017-10-15 NOTE — ED Notes (Signed)
Given po fluids 

## 2017-12-20 ENCOUNTER — Emergency Department
Admission: EM | Admit: 2017-12-20 | Discharge: 2017-12-20 | Disposition: A | Discharge status: Home / Self Care | Payer: Self-pay | Attending: Emergency Medicine | Admitting: Emergency Medicine

## 2017-12-20 ENCOUNTER — Encounter: Payer: Self-pay | Admitting: Emergency Medicine

## 2017-12-20 ENCOUNTER — Emergency Department: Payer: Self-pay

## 2017-12-20 DIAGNOSIS — I10 Essential (primary) hypertension: Secondary | ICD-10-CM | POA: Insufficient documentation

## 2017-12-20 DIAGNOSIS — M5412 Radiculopathy, cervical region: Secondary | ICD-10-CM | POA: Insufficient documentation

## 2017-12-20 LAB — BASIC METABOLIC PANEL
Anion gap: 8 (ref 5–15)
BUN: 15 mg/dL (ref 6–20)
CALCIUM: 9.1 mg/dL (ref 8.9–10.3)
CHLORIDE: 105 mmol/L (ref 101–111)
CO2: 25 mmol/L (ref 22–32)
CREATININE: 1.03 mg/dL (ref 0.61–1.24)
GFR calc non Af Amer: >60 mL/min (ref >60)
Glucose, Bld: 105 mg/dL — ABNORMAL HIGH (ref 65–99)
Potassium: 3.7 mmol/L (ref 3.5–5.1)
SODIUM: 138 mmol/L (ref 135–145)

## 2017-12-20 LAB — TROPONIN I

## 2017-12-20 LAB — CBC
HCT: 39.7 % — ABNORMAL LOW (ref 40.0–52.0)
Hemoglobin: 13.8 g/dL (ref 13.0–18.0)
MCH: 32.5 pg (ref 26.0–34.0)
MCHC: 34.7 g/dL (ref 32.0–36.0)
MCV: 93.5 fL (ref 80.0–100.0)
PLATELETS: 173 10*3/uL (ref 150–440)
RBC: 4.25 MIL/uL — AB (ref 4.40–5.90)
RDW: 12.5 % (ref 11.5–14.5)
WBC: 5.5 10*3/uL (ref 3.8–10.6)

## 2017-12-20 MED ORDER — OXYCODONE-ACETAMINOPHEN 5-325 MG PO TABS: 1.0000 | ORAL_TABLET | Freq: Three times a day (TID) | ORAL | 0 refills | Status: AC | PRN

## 2017-12-20 MED ORDER — PREDNISONE 10 MG (21) PO TBPK: ORAL_TABLET | Freq: Every day | ORAL | 0 refills | Status: AC

## 2017-12-20 MED ORDER — PREDNISONE 20 MG PO TABS
60.0000 mg | ORAL_TABLET | Freq: Once | ORAL | Status: AC
  Administered 2017-12-20: 60 mg via ORAL
  Filled 2017-12-20: qty 3

## 2017-12-20 NOTE — ED Triage Notes (Signed)
Pt comes into the ED via POV c/o numbness and right arm pain that started Tuesday and has been intermittent.  Patient states the pain has gotten worse today.  Patient states he has HTN but denies any diabetes.  The numbness is described as a tingling sensation in the fingers of the right arm and sharp pain to the upper arm.  Denies any chest pain, shortness of breath, or dizziness.  Patient is neurologically intact at this time and in NAD with even and unlabored respirations.

## 2017-12-20 NOTE — ED Notes (Signed)
Patient transported to X-ray 

## 2017-12-20 NOTE — ED Provider Notes (Signed)
[facility] Emergency Department Provider Note       Time seen: ----------------------------------------- 3:02 PM on 12/20/2017 -----------------------------------------   I have reviewed the triage vital signs and the nursing notes.  HISTORY   Chief Complaint Numbness and Arm Pain    HPI Kyle Barnes is a 47 y.o. male with a history of HIV, hypertension, pyelonephritis who presents to the ED for numbness and tingling in the right arm.  Patient has had pain that started Tuesday and has been intermittent.  Patient states the pain is gotten worse today.  He reports he has hypertension but denies any diabetes.  He does describe some tingling more around his thumb and index finger on the right arm.  He has had some sharp pain in his right upper arm as well.  He denies chest pain or difficulty breathing.  He denies any numbness currently.  Past Medical History:  Diagnosis Date  . Carpal tunnel syndrome   . HIV (human immunodeficiency virus infection) (HCC)   . Hypertension   . Pyelonephritis 09/29/2016    Patient Active Problem List   Diagnosis Date Noted  . Pyelonephritis 09/29/2016  . UTI (urinary tract infection) 09/29/2016    Past Surgical History:  Procedure Laterality Date  . FRACTURE SURGERY      Allergies Patient has no known allergies.  Social History Social History   Tobacco Use  . Smoking status: Never Smoker  . Smokeless tobacco: Never Used  Substance Use Topics  . Alcohol use: Yes    Alcohol/week: 1.2 oz    Types: 2 Shots of liquor per week    Comment: daily  . Drug use: No    Review of Systems Constitutional: Negative for fever. Cardiovascular: Negative for chest pain. Respiratory: Negative for shortness of breath. Gastrointestinal: Negative for abdominal pain, vomiting and diarrhea. Musculoskeletal: Positive for right arm pain Skin: Negative for rash. Neurological: Positive for paresthesias in the right arm  All  systems negative/normal/unremarkable except as stated in the HPI  ____________________________________________   PHYSICAL EXAM:  VITAL SIGNS: ED Triage Vitals [12/20/17 1255]  Enc Vitals Group     BP (!) 164/131     Pulse Rate 82     Resp 18     Temp 98.6 F (37 C)     Temp Source Oral     SpO2 97 %     Weight 240 lb (108.9 kg)     Height 6\' 2"  (1.88 m)     Head Circumference      Peak Flow      Pain Score 8     Pain Loc      Pain Edu?      Excl. in GC?     Constitutional: Alert and oriented. Well appearing and in no distress. Eyes: Conjunctivae are normal. Normal extraocular movements. ENT   Head: Normocephalic and atraumatic.   Nose: No congestion/rhinnorhea.   Mouth/Throat: Mucous membranes are moist.   Neck: No stridor. Cardiovascular: Normal rate, regular rhythm. No murmurs, rubs, or gallops. Respiratory: Normal respiratory effort without tachypnea nor retractions. Breath sounds are clear and equal bilaterally. No wheezes/rales/rhonchi. Gastrointestinal: Soft and nontender. Normal bowel sounds Musculoskeletal: Nontender with normal range of motion in extremities. No lower extremity tenderness nor edema. Neurologic:  Normal speech and language. No gross focal neurologic deficits are appreciated.  Joint, sensation, cranial nerves appear to be normal.  He does describe intermittent paresthesias around C6 or C7 distribution Skin:  Skin is warm, dry and intact.  No rash noted. Psychiatric: Mood and affect are normal. Speech and behavior are normal.  ____________________________________________  EKG: Interpreted by me.  Sinus rhythm rate 86 bpm, normal PR interval, normal QRS, normal QT.  ____________________________________________  ED COURSE:  As part of my medical decision making, I reviewed the following data within the electronic MEDICAL RECORD NUMBER History obtained from family if available, nursing notes, old chart and ekg, as well as notes from prior ED  visits. Patient presented for paresthesias and possible cervical radiculopathy, we will assess with labs and imaging as indicated at this time.   Procedures ____________________________________________   LABS (pertinent positives/negatives)  Labs Reviewed  BASIC METABOLIC PANEL - Abnormal; Notable for the following components:      Result Value   Glucose, Bld 105 (*)    All other components within normal limits  CBC - Abnormal; Notable for the following components:   RBC 4.25 (*)    HCT 39.7 (*)    All other components within normal limits  TROPONIN I  URINALYSIS, COMPLETE (UACMP) WITH MICROSCOPIC  CBG MONITORING, ED    RADIOLOGY Images were viewed by me  C-spine series  IMPRESSION: Degenerative changes cervical spine with degenerative changes most prominent C5-C6 and C6-C7. Prominent disc space loss and endplate osteophyte formation at these levels. No acute bony abnormality. ____________________________________________  DIFFERENTIAL DIAGNOSIS   Cervical radiculopathy, muscle strain, CVA, dissection, muscle spasm  FINAL ASSESSMENT AND PLAN  Cervical radiculopathy   Plan: Patient had presented for paresthesias and pain in the right arm. Patient's labs are reassuring. Patient's imaging did reveal degenerative changes in the cervical spine likely indicating that this is indeed cervical radiculopathy.  He will be referred to orthopedics for outpatient follow-up and possible need for MRI.  He will be discharged with steroids and pain medicine.   Emily FilbertWilliams, Nila Winker E, MD   Note: This note was generated in part or whole with voice recognition software. Voice recognition is usually quite accurate but there are transcription errors that can and very often do occur. I apologize for any typographical errors that were not detected and corrected.     Emily FilbertWilliams, Alizah Sills E, MD 12/20/17 1537

## 2017-12-27 ENCOUNTER — Emergency Department (HOSPITAL_BASED_OUTPATIENT_CLINIC_OR_DEPARTMENT_OTHER)
Admission: EM | Admit: 2017-12-27 | Discharge: 2017-12-27 | Disposition: A | Discharge status: Home / Self Care | Payer: Self-pay | Attending: Emergency Medicine | Admitting: Emergency Medicine

## 2017-12-27 ENCOUNTER — Other Ambulatory Visit: Payer: Self-pay

## 2017-12-27 ENCOUNTER — Emergency Department (HOSPITAL_COMMUNITY): Payer: Self-pay

## 2017-12-27 ENCOUNTER — Encounter (HOSPITAL_BASED_OUTPATIENT_CLINIC_OR_DEPARTMENT_OTHER): Payer: Self-pay | Admitting: Emergency Medicine

## 2017-12-27 DIAGNOSIS — Z79899 Other long term (current) drug therapy: Secondary | ICD-10-CM | POA: Insufficient documentation

## 2017-12-27 DIAGNOSIS — M79601 Pain in right arm: Secondary | ICD-10-CM

## 2017-12-27 DIAGNOSIS — M5412 Radiculopathy, cervical region: Secondary | ICD-10-CM | POA: Insufficient documentation

## 2017-12-27 DIAGNOSIS — I10 Essential (primary) hypertension: Secondary | ICD-10-CM | POA: Insufficient documentation

## 2017-12-27 MED ORDER — CYCLOBENZAPRINE HCL 10 MG PO TABS: 10.0000 mg | ORAL_TABLET | Freq: Two times a day (BID) | ORAL | 0 refills | Status: AC | PRN

## 2017-12-27 MED ORDER — OXYCODONE-ACETAMINOPHEN 5-325 MG PO TABS
1.0000 | ORAL_TABLET | Freq: Once | ORAL | Status: AC
  Administered 2017-12-27: 1 via ORAL
  Filled 2017-12-27: qty 1

## 2017-12-27 MED ORDER — GADOBENATE DIMEGLUMINE 529 MG/ML IV SOLN
20.0000 mL | Freq: Once | INTRAVENOUS | Status: AC
  Administered 2017-12-27: 20 mL via INTRAVENOUS

## 2017-12-27 MED ORDER — TRAMADOL HCL 50 MG PO TABS: 50.0000 mg | ORAL_TABLET | Freq: Four times a day (QID) | ORAL | 0 refills | Status: AC | PRN

## 2017-12-27 NOTE — ED Notes (Signed)
Pt taken to MRI  

## 2017-12-27 NOTE — ED Notes (Signed)
Pt d/c amb with quick steady gait in nad, verbalizes understanding of need to [facility] and present to triage for mri.

## 2017-12-27 NOTE — ED Notes (Signed)
PT states understanding of care given, follow up care, and medication prescribed. PT ambulated from ED to car with a steady gait. 

## 2017-12-27 NOTE — ED Provider Notes (Signed)
[facility] EMERGENCY DEPARTMENT Provider Note   CSN: [id number] Arrival date & time: 12/27/17  1008     History   Chief Complaint Chief Complaint  Patient presents with  . Arm Pain    HPI Kyle Barnes is a 47 y.o. male with history of HIV presenting with worsening left shoulder pain and left arm numbness. Patient was initially seen in the ED on 1/24 with 2 days of left arm numbness, at which time he was found to have cervical spine degenerative changes and osteophyte formation at the C5-C6 and C6-C7 levels. He was referred to orthopedics but was unable to get in for an MRI due to lack of insurance, per patient.  Since 1/24 patient has experienced worsening left arm numbness including 4th and 5th digit numbness, as well as pain over his left shoulder. He endorses feelings of weakness in the arm but has been able to lift objects at work without dropping anything. No recent fevers or night sweats.   Past Medical History:  Diagnosis Date  . Carpal tunnel syndrome   . HIV (human immunodeficiency virus infection) (HCC)   . Hypertension   . Pyelonephritis 09/29/2016    Patient Active Problem List   Diagnosis Date Noted  . Pyelonephritis 09/29/2016  . UTI (urinary tract infection) 09/29/2016    Past Surgical History:  Procedure Laterality Date  . FRACTURE SURGERY         Home Medications    Prior to Admission medications   Medication Sig Start Date End Date Taking? Authorizing Provider  hydrochlorothiazide (HYDRODIURIL) 12.5 MG tablet Take 12.5 mg by mouth daily.   Yes [provider]  lisinopril (PRINIVIL,ZESTRIL) 10 MG tablet Take 10 mg by mouth daily.   Yes [provider]  oxyCODONE-acetaminophen (PERCOCET) 5-325 MG tablet Take 1-2 tablets by mouth every 8 (eight) hours as needed. 12/20/17  Yes Emily FilbertWilliams, Jonathan E, MD  predniSONE (STERAPRED UNI-PAK 21 TAB) 10 MG (21) TBPK tablet Take by mouth daily. Dispense steroid taper pack as directed  12/20/17  Yes Emily FilbertWilliams, Jonathan E, MD  elvitegravir-cobicistat-emtricitabine-tenofovir (GENVOYA) 150-150-200-10 MG TABS tablet Take 1 tablet by mouth daily with breakfast.    [provider]  gabapentin (NEURONTIN) 100 MG capsule Take 100 mg by mouth 3 (three) times daily.    [provider]  HYDROcodone-acetaminophen (NORCO/VICODIN) 5-325 MG tablet Take 1 tablet by mouth every 4 (four) hours as needed. 10/01/16   Jacalyn LefevreHaviland, Julie, MD  oxyCODONE-acetaminophen (PERCOCET/ROXICET) 5-325 MG per tablet 1 to 2 tabs PO q6hrs  PRN for pain Patient not taking: Reported on 09/29/2016 01/26/15   Rolan BuccoBelfi, Melanie, MD  sulfamethoxazole-trimethoprim (BACTRIM DS) 800-160 MG per tablet Take 1 tablet by mouth 2 (two) times daily. Patient not taking: Reported on 09/29/2016 01/24/15   Pisciotta, Mardella LaymanNicole, PA-C    Family History No family history on file.  Social History Social History   Tobacco Use  . Smoking status: Never Smoker  . Smokeless tobacco: Never Used  Substance Use Topics  . Alcohol use: Yes    Alcohol/week: 1.2 oz    Types: 2 Shots of liquor per week    Comment: daily  . Drug use: No     Allergies   Patient has no known allergies.   Review of Systems Review of Systems   Physical Exam Updated Vital Signs BP (!) 150/102 (BP Location: Left Arm)   Pulse 78   Temp 98.9 F (37.2 C) (Oral)   Resp 18   SpO2 96%  Physical Exam  Constitutional: He appears well-developed and well-nourished. No distress.  Neck: Neck supple.  +spurlings. No spinal process tenderness.  Musculoskeletal:  +ttp over left deltoid. +palpable ulnar and radial pulses.  Neurological:  Left upper extremity: 3/5 abduction and adduction of fingers, 3/5 wrist flexion and extension. 4/5 internal and external rotation of the forearm at the elbow, exam limited due to shoulder pain. 4/5 abduction of the arm at the shoulder, exam limited due to shoulder pain.  Right upper extrmity: 5/5 strength in all muscle  groups tested  Skin: Skin is warm.     ED Treatments / Results  Labs (all labs ordered are listed, but only abnormal results are displayed) Labs Reviewed - No data to display  EKG  EKG Interpretation None       Radiology No results found.  Procedures Procedures (including critical care time)  Medications Ordered in ED Medications - No data to display   Initial Impression / Assessment and Plan / ED Course  I have reviewed the triage vital signs and the nursing notes.  Pertinent labs & imaging results that were available during my care of the patient were reviewed by me and considered in my medical decision making (see chart for details).     [id number] with history of HIV and recently diagnosed cervical radiculopathy presents with left arm numbness, found to have weakness in the ulnar nerve distribution on exam. Patient reports that his HIV is followed by a physician in [place]. He endorses compliance with anti-retroviral therapy and he reports recent follow up with his physician with blood work which he was told looked good.  Given new finding of weakness and increased risk of abscess with history of HIV, patient needs MRI of the cervical spine sooner than he would be able to obtain as an outpatient. Because of this decision was made to transfer patient to [facility] ED for MRI w/wout contrast. Patient was accepted by ED physician and is stable for transfer via private vehicle.   Final Clinical Impressions(s) / ED Diagnoses   Final diagnoses:  Cervical radiculopathy  Left arm weakness   ED Discharge Orders    None       Howard Pouch, MD 12/27/17 1248    Alvira Monday, MD 12/28/17 [phone]

## 2017-12-27 NOTE — ED Notes (Signed)
Patient was [facility]  [facility]

## 2017-12-27 NOTE — ED Triage Notes (Signed)
Pt having continued arm numbness for over several years.  Pt states he was seen on 1/24 for this condition and was diagnosed with pinched nerve.  Pt states he went back to work and now the numbness seems worse and he feels like there is some swelling.

## 2017-12-27 NOTE — Discharge Instructions (Addendum)
Take ibuprofen for pain.  Take tramadol for severe pain.  Flexeril for muscle spasms.  Please follow-up with your family doctor or neurosurgery for further evaluation and treatment of your symptoms and your arm.  Return if worsening symptoms.

## 2017-12-27 NOTE — ED Provider Notes (Signed)
4:25 PM Transfer [facility].  Patient reports ongoing neck pain and now severe arm pain radiating down into the hand.  Reports some numbness in his hand, specifically in the fourth and fifth fingers.  He states he is fingers feel like they are asleep.  Patient has been seen for the same in the past, treated with prednisone and Percocet.  He states he ran out of both.  He took ibuprofen last night which did not help.  Was found to have right arm weakness today during examination was transferred here to get an MRI of his neck.  Patient does have history of HIV infection, MR with and without contrast ordered.  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  No findings of infection noted.  There is multilevel neural foraminal narrowing, most severe on the left.  Patient symptoms are on the right.  It is interesting however that patient's chart from [facility] states that his symptoms are on the left, however I rechecked with patient just now, and his pain is definitely on the right hand.  There is nothing on this MRI that would prompt emergent treatment in the emergency department.  He is neurovascular intact at this time.  I checked his neurological exam, on my exam, normal strength of bilateral grip, tricepts, biceps, deltoid.  He is neurovascularly intact.  Equal distal radial pulses bilaterally.  He does have slightly decreased sensation to the right fourth and fifth fingers, otherwise unremarkable.  Discussed results with patient.  Will discharge home with tramadol for pain and Flexeril for muscle spasms.  We will have him follow-up with neurosurgery.  Return precautions discussed.  Vitals:   12/27/17 1026  BP: (!) 150/102  Pulse: 78  Resp: 18  Temp: 98.9 F (37.2 C)  TempSrc: Oral  SpO2: 96%      Jaynie CrumbleKirichenko, Jerianne Anselmo, PA-C 12/27/17 2019    Phillis HaggisMabe, Martha L, MD 12/27/17 2020

## 2017-12-27 NOTE — ED Notes (Signed)
ED Provider at bedside. 

## 2018-03-27 ENCOUNTER — Emergency Department (HOSPITAL_BASED_OUTPATIENT_CLINIC_OR_DEPARTMENT_OTHER)
Admission: EM | Admit: 2018-03-27 | Discharge: 2018-03-27 | Disposition: A | Discharge status: Home / Self Care | Payer: Self-pay | Attending: Emergency Medicine | Admitting: Emergency Medicine

## 2018-03-27 ENCOUNTER — Other Ambulatory Visit: Payer: Self-pay

## 2018-03-27 ENCOUNTER — Encounter (HOSPITAL_BASED_OUTPATIENT_CLINIC_OR_DEPARTMENT_OTHER): Payer: Self-pay | Admitting: Emergency Medicine

## 2018-03-27 DIAGNOSIS — R42 Dizziness and giddiness: Secondary | ICD-10-CM

## 2018-03-27 DIAGNOSIS — I10 Essential (primary) hypertension: Secondary | ICD-10-CM | POA: Insufficient documentation

## 2018-03-27 DIAGNOSIS — R531 Weakness: Secondary | ICD-10-CM | POA: Insufficient documentation

## 2018-03-27 DIAGNOSIS — E860 Dehydration: Secondary | ICD-10-CM | POA: Insufficient documentation

## 2018-03-27 DIAGNOSIS — Z79899 Other long term (current) drug therapy: Secondary | ICD-10-CM | POA: Insufficient documentation

## 2018-03-27 DIAGNOSIS — R110 Nausea: Secondary | ICD-10-CM | POA: Insufficient documentation

## 2018-03-27 DIAGNOSIS — T43625A Adverse effect of amphetamines, initial encounter: Secondary | ICD-10-CM | POA: Insufficient documentation

## 2018-03-27 DIAGNOSIS — B20 Human immunodeficiency virus [HIV] disease: Secondary | ICD-10-CM | POA: Insufficient documentation

## 2018-03-27 LAB — CBC WITH DIFFERENTIAL/PLATELET
Basophils Absolute: 0 10*3/uL (ref 0.0–0.1)
Basophils Relative: 1 %
EOS PCT: 8 %
Eosinophils Absolute: 0.3 10*3/uL (ref 0.0–0.7)
HEMATOCRIT: 41.8 % (ref 39.0–52.0)
Hemoglobin: 15.4 g/dL (ref 13.0–17.0)
Lymphocytes Relative: 38 %
Lymphs Abs: 1.3 10*3/uL (ref 0.7–4.0)
MCH: 32.6 pg (ref 26.0–34.0)
MCHC: 36.8 g/dL — AB (ref 30.0–36.0)
MCV: 88.4 fL (ref 78.0–100.0)
MONO ABS: 0.4 10*3/uL (ref 0.1–1.0)
MONOS PCT: 11 %
NEUTROS ABS: 1.5 10*3/uL — AB (ref 1.7–7.7)
Neutrophils Relative %: 42 %
PLATELETS: 205 10*3/uL (ref 150–400)
RBC: 4.73 MIL/uL (ref 4.22–5.81)
RDW: 11.8 % (ref 11.5–15.5)
WBC: 3.5 10*3/uL — ABNORMAL LOW (ref 4.0–10.5)

## 2018-03-27 LAB — COMPREHENSIVE METABOLIC PANEL
ALBUMIN: 4.1 g/dL (ref 3.5–5.0)
ALK PHOS: 52 U/L (ref 38–126)
ALT: 40 U/L (ref 17–63)
ANION GAP: 8 (ref 5–15)
AST: 45 U/L — AB (ref 15–41)
BILIRUBIN TOTAL: 1.1 mg/dL (ref 0.3–1.2)
BUN: 14 mg/dL (ref 6–20)
CALCIUM: 8.6 mg/dL — AB (ref 8.9–10.3)
CO2: 25 mmol/L (ref 22–32)
Chloride: 103 mmol/L (ref 101–111)
Creatinine, Ser: 1.14 mg/dL (ref 0.61–1.24)
GFR calc Af Amer: >60 mL/min (ref >60)
GFR calc non Af Amer: >60 mL/min (ref >60)
GLUCOSE: 101 mg/dL — AB (ref 65–99)
Potassium: 3.2 mmol/L — ABNORMAL LOW (ref 3.5–5.1)
SODIUM: 136 mmol/L (ref 135–145)
Total Protein: 8 g/dL (ref 6.5–8.1)

## 2018-03-27 LAB — URINALYSIS, ROUTINE W REFLEX MICROSCOPIC
BILIRUBIN URINE: NEGATIVE
GLUCOSE, UA: NEGATIVE mg/dL
Hgb urine dipstick: NEGATIVE
KETONES UR: NEGATIVE mg/dL
Leukocytes, UA: NEGATIVE
Nitrite: NEGATIVE
PH: 6 (ref 5.0–8.0)
Protein, ur: NEGATIVE mg/dL
Specific Gravity, Urine: 1.01 (ref 1.005–1.030)

## 2018-03-27 LAB — RAPID URINE DRUG SCREEN, HOSP PERFORMED
Amphetamines: POSITIVE — AB
BARBITURATES: NOT DETECTED
Benzodiazepines: NOT DETECTED
Cocaine: NOT DETECTED
Opiates: NOT DETECTED
Tetrahydrocannabinol: NOT DETECTED

## 2018-03-27 MED ORDER — SODIUM CHLORIDE 0.9 % IV BOLUS
1000.0000 mL | Freq: Once | INTRAVENOUS | Status: AC
  Administered 2018-03-27: 1000 mL via INTRAVENOUS

## 2018-03-27 MED ORDER — ONDANSETRON HCL 4 MG/2ML IJ SOLN
4.0000 mg | Freq: Once | INTRAMUSCULAR | Status: AC
  Administered 2018-03-27: 4 mg via INTRAVENOUS
  Filled 2018-03-27: qty 2

## 2018-03-27 NOTE — ED Notes (Signed)
NAD at this time. Pt is stable and going home.  

## 2018-03-27 NOTE — ED Provider Notes (Signed)
[facility] EMERGENCY DEPARTMENT Provider Note   CSN: [id number] Arrival date & time: 03/27/18  7829     History   Chief Complaint Chief Complaint  Patient presents with  . Dizziness    HPI Kyle Barnes is a 47 y.o. male.  Pt presents to the ED today with dizziness.  Pt said he took a pill on Saturday the 27th that he thought was Ecstasy.  Since then, he has been feeling dizzy and weak.  He feels nauseous.  He does have a hx of HIV and is followed by an ID clinic in [place].  He said his viral load is low and CD4 counts are good, but does not know numbers.  Last ones in Epic are 09/29/2016.  Pt denies f/c.  No coughing.     Past Medical History:  Diagnosis Date  . Carpal tunnel syndrome   . HIV (human immunodeficiency virus infection) (HCC)   . Hypertension   . Pyelonephritis 09/29/2016    Patient Active Problem List   Diagnosis Date Noted  . Pyelonephritis 09/29/2016  . UTI (urinary tract infection) 09/29/2016    Past Surgical History:  Procedure Laterality Date  . FRACTURE SURGERY          Home Medications    Prior to Admission medications   Medication Sig Start Date End Date Taking? Authorizing Provider  elvitegravir-cobicistat-emtricitabine-tenofovir (GENVOYA) 150-150-200-10 MG TABS tablet Take 1 tablet by mouth daily with breakfast.   Yes [provider]  gabapentin (NEURONTIN) 100 MG capsule Take 100 mg by mouth 3 (three) times daily.   Yes [provider]  cyclobenzaprine (FLEXERIL) 10 MG tablet Take 1 tablet (10 mg total) by mouth 2 (two) times daily as needed for muscle spasms. 12/27/17   Kirichenko, Tatyana, PA-C  hydrochlorothiazide (HYDRODIURIL) 12.5 MG tablet Take 12.5 mg by mouth daily.    [provider]  HYDROcodone-acetaminophen (NORCO/VICODIN) 5-325 MG tablet Take 1 tablet by mouth every 4 (four) hours as needed. 10/01/16   Jacalyn Lefevre, MD  lisinopril (PRINIVIL,ZESTRIL) 10 MG tablet Take 10 mg by mouth  daily.    [provider]  oxyCODONE-acetaminophen (PERCOCET) 5-325 MG tablet Take 1-2 tablets by mouth every 8 (eight) hours as needed. 12/20/17   Emily Filbert, MD  predniSONE (STERAPRED UNI-PAK 21 TAB) 10 MG (21) TBPK tablet Take by mouth daily. Dispense steroid taper pack as directed 12/20/17   Emily Filbert, MD  sulfamethoxazole-trimethoprim (BACTRIM DS) 800-160 MG per tablet Take 1 tablet by mouth 2 (two) times daily. Patient not taking: Reported on 09/29/2016 01/24/15   Pisciotta, Joni Reining, PA-C  traMADol (ULTRAM) 50 MG tablet Take 1 tablet (50 mg total) by mouth every 6 (six) hours as needed. 12/27/17   Jaynie Crumble, PA-C    Family History No family history on file.  Social History Social History   Tobacco Use  . Smoking status: Never Smoker  . Smokeless tobacco: Never Used  Substance Use Topics  . Alcohol use: Yes    Alcohol/week: 1.2 oz    Types: 2 Shots of liquor per week    Comment: daily  . Drug use: No     Allergies   Patient has no known allergies.   Review of Systems Review of Systems  Gastrointestinal: Positive for nausea.  Neurological: Positive for dizziness and light-headedness.  All other systems reviewed and are negative.    Physical Exam Updated Vital Signs BP (!) 134/94   Pulse 83   Temp 97.7 F (36.5 C) (  Oral)   Resp (!) 29   Ht  (1.905 m)   Wt 113.4 kg (250 lb)   SpO2 100%   BMI 31.25 kg/m   Physical Exam  Constitutional: He is oriented to person, place, and time. He appears well-developed and well-nourished.  HENT:  Head: Normocephalic and atraumatic.  Right Ear: External ear normal.  Left Ear: External ear normal.  Nose: Nose normal.  Mouth/Throat: Mucous membranes are dry.  Eyes: Pupils are equal, round, and reactive to light. Conjunctivae and EOM are normal.  Neck: Normal range of motion. Neck supple.  Cardiovascular: Regular rhythm, normal heart sounds and intact distal pulses. Tachycardia present.   Pulmonary/Chest: Effort normal and breath sounds normal.  Abdominal: Soft. Bowel sounds are normal.  Musculoskeletal: Normal range of motion.  Neurological: He is alert and oriented to person, place, and time.  Skin: Skin is warm. Capillary refill takes less than 2 seconds.  Psychiatric: He has a normal mood and affect. His behavior is normal. Judgment and thought content normal.  Nursing note and vitals reviewed.    ED Treatments / Results  Labs (all labs ordered are listed, but only abnormal results are displayed) Labs Reviewed  COMPREHENSIVE METABOLIC PANEL - Abnormal; Notable for the following components:      Result Value   Potassium 3.2 (*)    Glucose, Bld 101 (*)    Calcium 8.6 (*)    AST 45 (*)    All other components within normal limits  CBC WITH DIFFERENTIAL/PLATELET - Abnormal; Notable for the following components:   WBC 3.5 (*)    MCHC 36.8 (*)    Neutro Abs 1.5 (*)    All other components within normal limits  RAPID URINE DRUG SCREEN, HOSP PERFORMED - Abnormal; Notable for the following components:   Amphetamines POSITIVE (*)    All other components within normal limits  URINALYSIS, ROUTINE W REFLEX MICROSCOPIC    EKG EKG Interpretation  Date/Time:  Wednesday Mar 27 2018 07:44:38 EDT Ventricular Rate:  75 PR Interval:    QRS Duration: 111 QT Interval:  389 QTC Calculation: 435 R Axis:   50 Text Interpretation:  Sinus rhythm RSR' in V1 or V2, right VCD or RVH ST elev, probable normal early repol pattern No significant change since last tracing Confirmed by Jacalyn Lefevre [phone]) on 03/27/2018 8:47:49 AM   Radiology No results found.  Procedures Procedures (including critical care time)  Medications Ordered in ED Medications  sodium chloride 0.9 % bolus 1,000 mL (0 mLs Intravenous Stopped 03/27/18 0912)  ondansetron (ZOFRAN) injection 4 mg (4 mg Intravenous Given 03/27/18 0808)     Initial Impression / Assessment and Plan / ED Course  I have  reviewed the triage vital signs and the nursing notes.  Pertinent labs & imaging results that were available during my care of the patient were reviewed by me and considered in my medical decision making (see chart for details).   Pt is feeling much better after 1L NS.  He no longer feels dizzy.  He is told of the + amphetamine in his urine.  The ecstasy he took was probably contaminated as he does not recall taking anything else that would cause a + result.  Pt encouraged to return if worse and to f/u with pcp.  Final Clinical Impressions(s) / ED Diagnoses   Final diagnoses:  Dehydration  Dizziness  Amphetamine adverse reaction, initial encounter    ED Discharge Orders    None  Jacalyn Lefevre, MD 03/27/18 1007

## 2018-03-27 NOTE — ED Triage Notes (Signed)
Pt states he took a pill that he thought was ecstasy on Saturday and now is having dizziness since then.  Pt has numerous mild complaints from headache, chest pain, abdominal pain, dysuria, difficulty thinking, etc.  No neuro deficits noted.

## 2018-11-28 IMAGING — MR MR CERVICAL SPINE WO/W CM
4 of 8 series · 18 of 48 positions shown · IV contrast (multihance)
Comparison: Cervical spine radiographs [DATE]

CLINICAL DATA: Chronic neck pain, acutely worsening since [REDACTED]. Numbness LEFT arm and hand. History of HIV, assess for abscess
or infection.

EXAM:
MRI CERVICAL SPINE WITHOUT AND WITH CONTRAST
TECHNIQUE: Multiplanar and multiecho pulse sequences of the cervical spine, to
include the craniocervical junction and cervicothoracic junction,
were obtained without and with intravenous contrast.
CONTRAST:  20mL MULTIHANCE GADOBENATE DIMEGLUMINE 529 MG/ML IV SOLN

[Series 2: T2 · sagittal · 3.0mm · 0.39mm/px · 3 of 13 slices shown (1 of 2)]
[im 1/13]
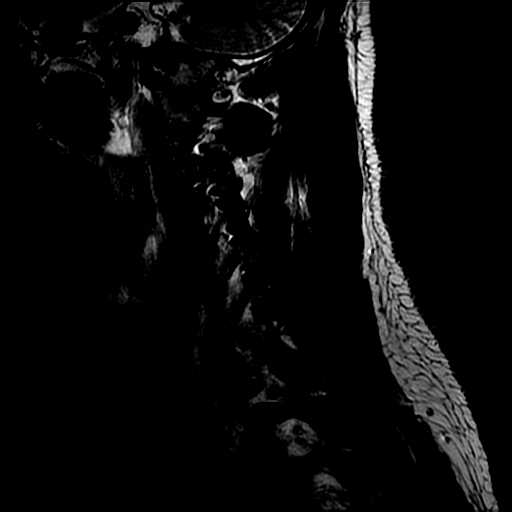
[im 7/13]
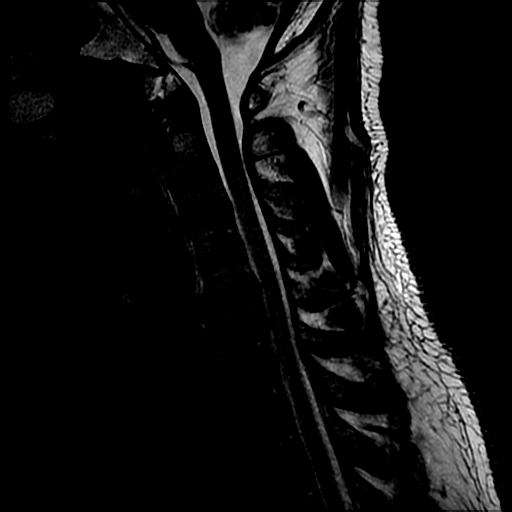
[im 13/13]
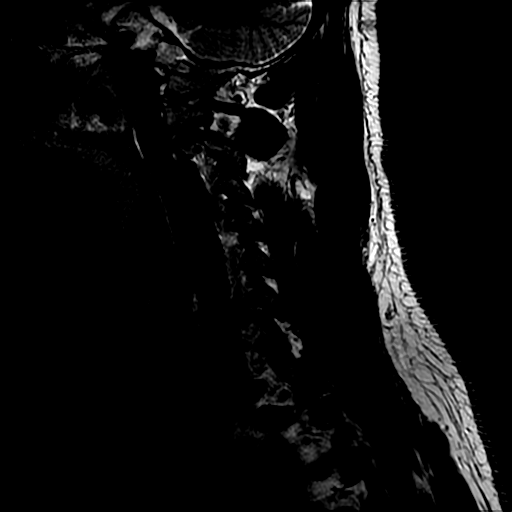

[Series 5: T1 · sagittal · 3.0mm · 0.39mm/px · 3 of 13 slices shown (1 of 2)]
[im 1/13]
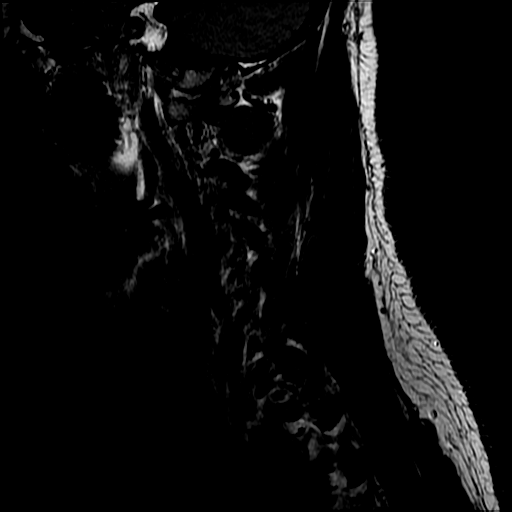
[im 7/13]
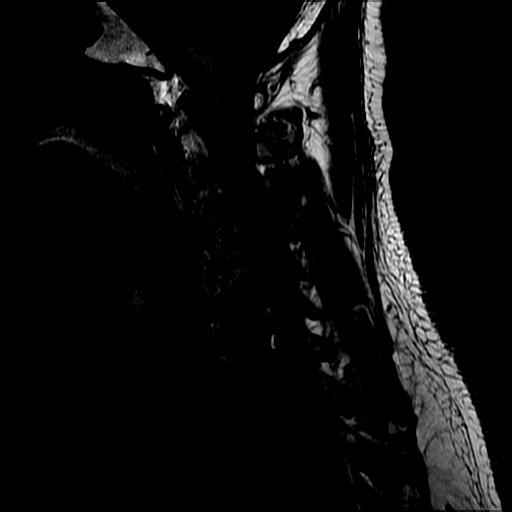
[im 13/13]
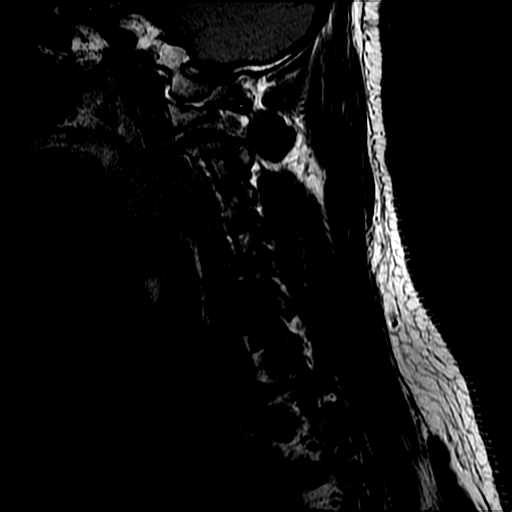

[Series 6: T2 · axial · 3.0mm · 0.39mm/px · z∈[-42,+70]mm · 9 of 34 slices shown (2 of 2)]
[im 1/34]
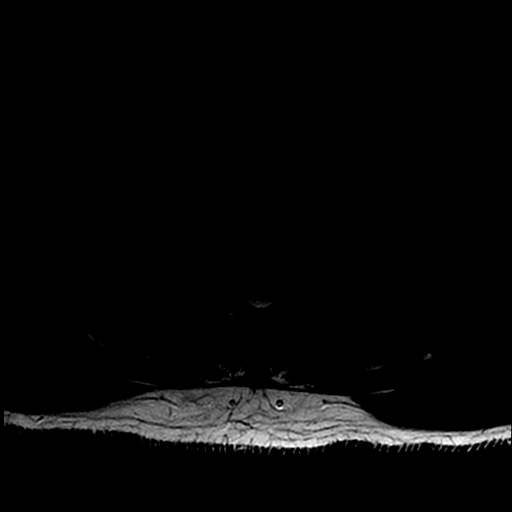
[im 5/34]
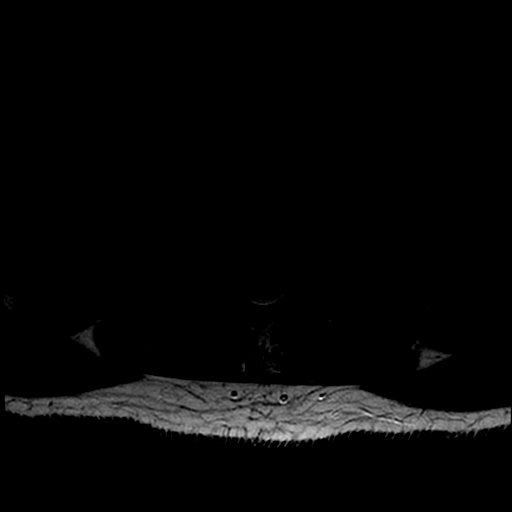
[im 9/34]
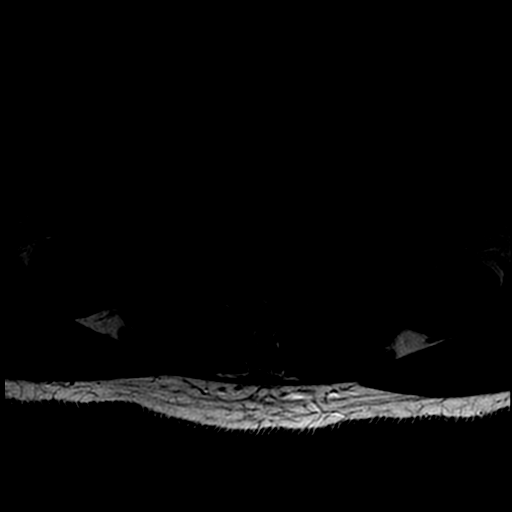
[im 13/34]
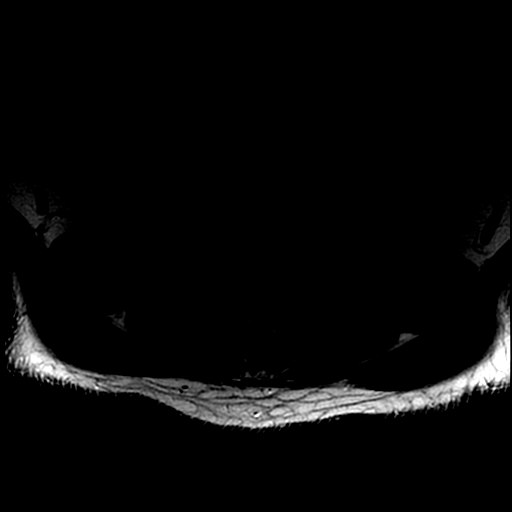
[im 17/34]
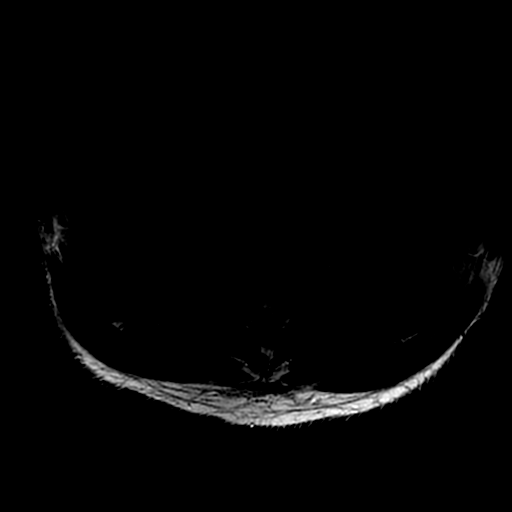
[im 21/34]
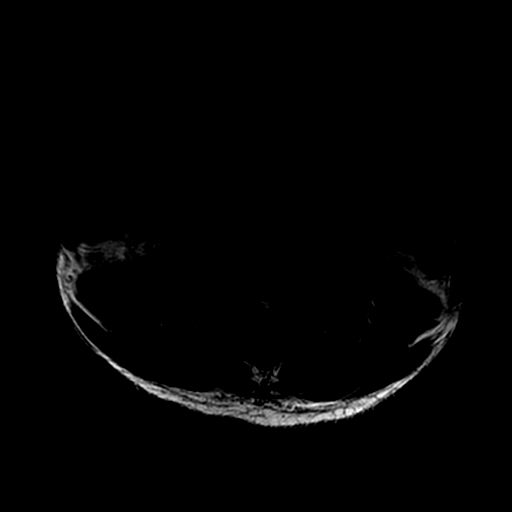
[im 25/34]
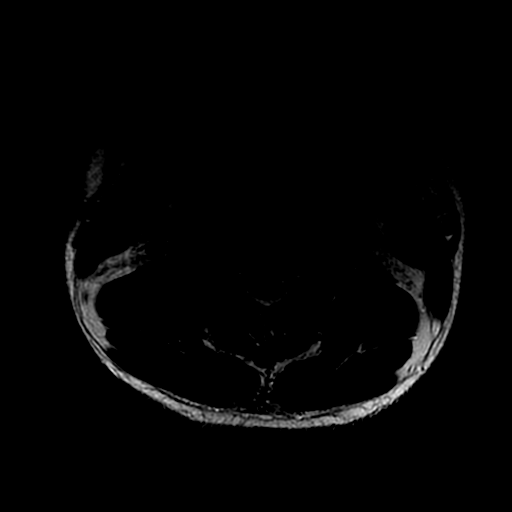
[im 29/34]
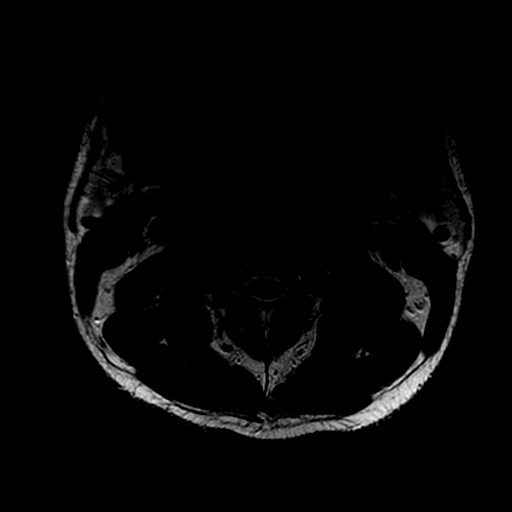
[im 34/34]
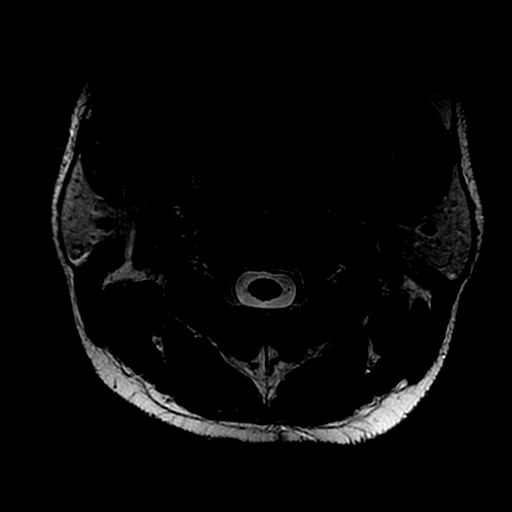

[Series 8: T1 · axial · non-contrast · 3.0mm · 0.39mm/px · z∈[-28,+53]mm · 3 of 34 slices shown (2 of 2)]
[im 5/34]
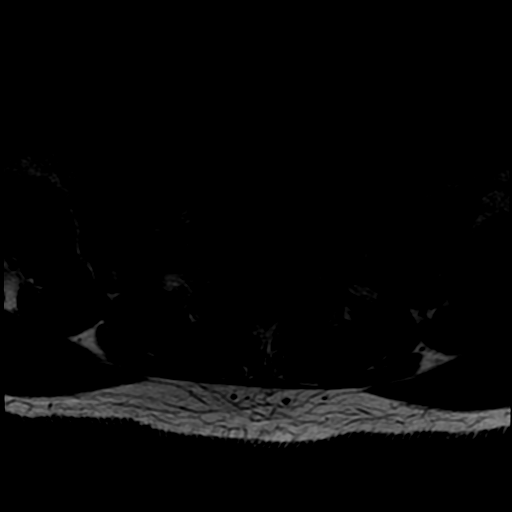
[im 17/34]
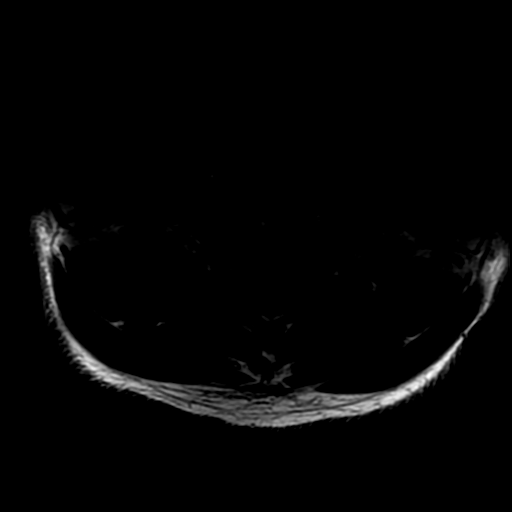
[im 29/34]
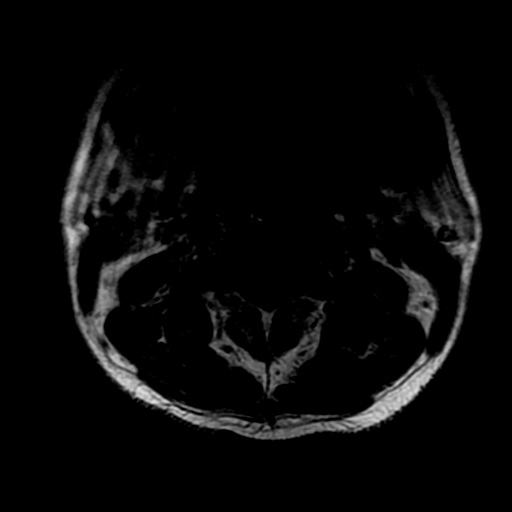

[18 of 48 positions shown; findings below may reference images not displayed]

FINDINGS: Sequences vary from mild to moderately motion degraded.

ALIGNMENT: Straightened cervical lordosis.  No malalignment.

VERTEBRAE/DISCS: Vertebral bodies are intact. Moderate C5-6 and C6-7
disc height loss with proportional chronic discogenic endplate
changes compatible with degenerative discs. Mild C7-T1 degenerative
disc. No acute or abnormal bone marrow signal. No abnormal osseous
or disc enhancement. Congenital canal narrowing on the basis of
foreshortened pedicles.

CORD:Cervical spinal cord is normal morphology and signal
characteristics from the cervicomedullary junction to level of T1-2,
the most caudal well visualized level. No abnormal spinal cord,
leptomeningeal or epidural enhancement.

POSTERIOR FOSSA, VERTEBRAL ARTERIES, PARASPINAL TISSUES: No MR
findings of ligamentous injury. Vertebral artery flow voids present.
Included posterior fossa and paraspinal soft tissues are normal.

DISC LEVELS:

C2-3: No disc bulge, canal stenosis nor neural foraminal narrowing.
Moderate RIGHT facet arthropathy.

C3-4: Small broad-based disc bulge, uncovertebral hypertrophy and
very mild facet arthropathy. No canal stenosis. Moderate RIGHT, mild
LEFT neural foraminal narrowing.

C4-5: Small central disc protrusion, no canal stenosis or neural
foraminal narrowing.

C5-6: Small broad-based disc bulge, uncovertebral hypertrophy. Mild
canal stenosis. Mild LEFT neural foraminal narrowing.

C6-7: Small broad-based disc bulge asymmetric to the LEFT.
Uncovertebral hypertrophy. Minimal canal stenosis. Moderate to
severe RIGHT, severe LEFT neural foraminal narrowing.

C7-T1: Small broad-based disc bulge, severe RIGHT facet arthropathy.
No canal stenosis. Severe RIGHT, moderate LEFT neural foraminal
narrowing.
IMPRESSION: 1. Motion degraded examination.
2. Degenerative change of the cervical spine superimposed on
congenital canal narrowing. No findings of infection in the cervical
spine.
3. Mild canal stenosis C5-6, minimal canal stenosis C6-7.
4. Multilevel neural foraminal narrowing: Severe on the LEFT at
C6-7.

## 2019-01-28 ENCOUNTER — Other Ambulatory Visit: Payer: Self-pay

## 2019-01-28 ENCOUNTER — Encounter (HOSPITAL_BASED_OUTPATIENT_CLINIC_OR_DEPARTMENT_OTHER): Payer: Self-pay | Admitting: *Deleted

## 2019-01-28 ENCOUNTER — Emergency Department (HOSPITAL_BASED_OUTPATIENT_CLINIC_OR_DEPARTMENT_OTHER)
Admission: EM | Admit: 2019-01-28 | Discharge: 2019-01-28 | Disposition: A | Discharge status: Home / Self Care | Payer: Private Health Insurance - Indemnity | Attending: Emergency Medicine | Admitting: Emergency Medicine

## 2019-01-28 DIAGNOSIS — I10 Essential (primary) hypertension: Secondary | ICD-10-CM | POA: Diagnosis not present

## 2019-01-28 DIAGNOSIS — B20 Human immunodeficiency virus [HIV] disease: Secondary | ICD-10-CM | POA: Insufficient documentation

## 2019-01-28 DIAGNOSIS — Z79899 Other long term (current) drug therapy: Secondary | ICD-10-CM | POA: Insufficient documentation

## 2019-01-28 DIAGNOSIS — L0231 Cutaneous abscess of buttock: Secondary | ICD-10-CM | POA: Insufficient documentation

## 2019-01-28 DIAGNOSIS — L089 Local infection of the skin and subcutaneous tissue, unspecified: Secondary | ICD-10-CM | POA: Diagnosis present

## 2019-01-28 MED ORDER — SULFAMETHOXAZOLE-TRIMETHOPRIM 800-160 MG PO TABS: 1.0000 | ORAL_TABLET | Freq: Two times a day (BID) | ORAL | 0 refills | Status: AC

## 2019-01-28 MED ORDER — CEPHALEXIN 500 MG PO CAPS: 500.0000 mg | ORAL_CAPSULE | Freq: Four times a day (QID) | ORAL | 0 refills | Status: AC

## 2019-01-28 MED ORDER — LIDOCAINE HCL (PF) 1 % IJ SOLN
5.0000 mL | Freq: Once | INTRAMUSCULAR | Status: AC
  Administered 2019-01-28: 5 mL via INTRADERMAL
  Filled 2019-01-28: qty 5

## 2019-01-28 NOTE — ED Notes (Signed)
DSD to buttock

## 2019-01-28 NOTE — ED Provider Notes (Signed)
[facility] EMERGENCY DEPARTMENT Provider Note   CSN: [id number] Arrival date & time: 01/28/19  1943    History   Chief Complaint Chief Complaint  Patient presents with  . Recurrent Skin Infections    HPI Kyle Barnes is a 48 y.o. male.     Patient is a 48 year old male with history of HIV disease, hypertension.  He presents today with complaints of pain and swelling to the inside of the right buttock.  This is worsened over the past several days.  He denies any fevers or chills.  He denies any injury or trauma.  The history is provided by the patient.    Past Medical History:  Diagnosis Date  . Carpal tunnel syndrome   . HIV (human immunodeficiency virus infection) (HCC)   . Hypertension   . Pyelonephritis 09/29/2016    Patient Active Problem List   Diagnosis Date Noted  . Pyelonephritis 09/29/2016  . UTI (urinary tract infection) 09/29/2016    Past Surgical History:  Procedure Laterality Date  . FRACTURE SURGERY          Home Medications    Prior to Admission medications   Medication Sig Start Date End Date Taking? Authorizing Provider  elvitegravir-cobicistat-emtricitabine-tenofovir (GENVOYA) 150-150-200-10 MG TABS tablet Take 1 tablet by mouth daily with breakfast.   Yes [provider]  gabapentin (NEURONTIN) 100 MG capsule Take 100 mg by mouth 3 (three) times daily.   Yes [provider]  lisinopril (PRINIVIL,ZESTRIL) 10 MG tablet Take 10 mg by mouth daily.   Yes [provider]  cyclobenzaprine (FLEXERIL) 10 MG tablet Take 1 tablet (10 mg total) by mouth 2 (two) times daily as needed for muscle spasms. 12/27/17   Kirichenko, Tatyana, PA-C  hydrochlorothiazide (HYDRODIURIL) 12.5 MG tablet Take 12.5 mg by mouth daily.    [provider]  HYDROcodone-acetaminophen (NORCO/VICODIN) 5-325 MG tablet Take 1 tablet by mouth every 4 (four) hours as needed. 10/01/16   Jacalyn Lefevre, MD  oxyCODONE-acetaminophen  (PERCOCET) 5-325 MG tablet Take 1-2 tablets by mouth every 8 (eight) hours as needed. 12/20/17   Emily Filbert, MD  predniSONE (STERAPRED UNI-PAK 21 TAB) 10 MG (21) TBPK tablet Take by mouth daily. Dispense steroid taper pack as directed 12/20/17   Emily Filbert, MD  sulfamethoxazole-trimethoprim (BACTRIM DS) 800-160 MG per tablet Take 1 tablet by mouth 2 (two) times daily. Patient not taking: Reported on 09/29/2016 01/24/15   Pisciotta, Joni Reining, PA-C  traMADol (ULTRAM) 50 MG tablet Take 1 tablet (50 mg total) by mouth every 6 (six) hours as needed. 12/27/17   Jaynie Crumble, PA-C    Family History No family history on file.  Social History Social History   Tobacco Use  . Smoking status: Never Smoker  . Smokeless tobacco: Never Used  Substance Use Topics  . Alcohol use: Yes    Alcohol/week: 2.0 standard drinks    Types: 2 Shots of liquor per week    Comment: daily  . Drug use: No     Allergies   Patient has no known allergies.   Review of Systems Review of Systems  All other systems reviewed and are negative.    Physical Exam Updated Vital Signs BP (!) 166/105 (BP Location: Right Arm)   Pulse 82   Temp 98.5 F (36.9 C) (Oral)   Resp 18   Ht 6' 2.75" (1.899 m)   Wt 117.9 kg   SpO2 98%   BMI 32.72 kg/m   Physical Exam Vitals signs  and nursing note reviewed.  Constitutional:      General: He is not in acute distress.    Appearance: Normal appearance. He is not ill-appearing, toxic-appearing or diaphoretic.  HENT:     Head: Normocephalic.  Pulmonary:     Effort: Pulmonary effort is normal.  Skin:    Comments: There is a swollen, tender, fluctuant area to the medial aspect of the right buttock.  Neurological:     Mental Status: He is alert.      ED Treatments / Results  Labs (all labs ordered are listed, but only abnormal results are displayed) Labs Reviewed - No data to display  EKG None  Radiology No results  found.  Procedures Procedures (including critical care time)  Medications Ordered in ED Medications  lidocaine (PF) (XYLOCAINE) 1 % injection 5 mL (has no administration in time range)     Initial Impression / Assessment and Plan / ED Course  I have reviewed the triage vital signs and the nursing notes.  Pertinent labs & imaging results that were available during my care of the patient were reviewed by me and considered in my medical decision making (see chart for details).  Abscess incised and drained.  Patient will be discharged with Keflex and Bactrim and follow-up as needed.  He is to apply warm soaks as frequently as possible and return if he experiences any additional problems.  INCISION AND DRAINAGE Performed by: Geoffery Lyons Consent: Verbal consent obtained. Risks and benefits: risks, benefits and alternatives were discussed Type: abscess  Body area: Left buttock  Anesthesia: local infiltration  Incision was made with a scalpel.  Local anesthetic: lidocaine 1 % without epinephrine  Anesthetic total: 2 ml  Complexity: complex Blunt dissection to break up loculations  Drainage: purulent  Drainage amount: Moderate  Packing material: No packing placed  Patient tolerance: Patient tolerated the procedure well with no immediate complications.     Final Clinical Impressions(s) / ED Diagnoses   Final diagnoses:  None    ED Discharge Orders    None       Geoffery Lyons, MD 01/28/19 2214

## 2019-01-28 NOTE — ED Triage Notes (Signed)
Pt reports "boil" right side buttocks x 1 year. States it is "always there" but usually not painful. C/o pain x 2 days

## 2019-01-28 NOTE — Discharge Instructions (Addendum)
Keflex and Bactrim as prescribed.  Apply warm compresses or perform sitz baths as frequently as possible for the next several days.  Return to the emergency department for worsening swelling, high fever, worsening pain, or other new and concerning symptoms.

## 2019-07-08 ENCOUNTER — Encounter: Payer: Self-pay | Admitting: Emergency Medicine

## 2019-07-08 ENCOUNTER — Emergency Department
Admission: EM | Admit: 2019-07-08 | Discharge: 2019-07-08 | Disposition: A | Discharge status: Home / Self Care | Payer: Private Health Insurance - Indemnity | Attending: Emergency Medicine | Admitting: Emergency Medicine

## 2019-07-08 ENCOUNTER — Other Ambulatory Visit: Payer: Self-pay

## 2019-07-08 DIAGNOSIS — R197 Diarrhea, unspecified: Secondary | ICD-10-CM | POA: Diagnosis not present

## 2019-07-08 DIAGNOSIS — B20 Human immunodeficiency virus [HIV] disease: Secondary | ICD-10-CM | POA: Diagnosis not present

## 2019-07-08 DIAGNOSIS — F10239 Alcohol dependence with withdrawal, unspecified: Secondary | ICD-10-CM | POA: Insufficient documentation

## 2019-07-08 DIAGNOSIS — I10 Essential (primary) hypertension: Secondary | ICD-10-CM | POA: Diagnosis not present

## 2019-07-08 DIAGNOSIS — Z79899 Other long term (current) drug therapy: Secondary | ICD-10-CM | POA: Insufficient documentation

## 2019-07-08 DIAGNOSIS — R51 Headache: Secondary | ICD-10-CM | POA: Diagnosis present

## 2019-07-08 LAB — CBC WITH DIFFERENTIAL/PLATELET
Abs Immature Granulocytes: 0.01 10*3/uL (ref 0.00–0.07)
Basophils Absolute: 0.1 10*3/uL (ref 0.0–0.1)
Basophils Relative: 1 %
Eosinophils Absolute: 0.2 10*3/uL (ref 0.0–0.5)
Eosinophils Relative: 4 %
HCT: 42 % (ref 39.0–52.0)
Hemoglobin: 14.7 g/dL (ref 13.0–17.0)
Immature Granulocytes: 0 %
Lymphocytes Relative: 37 %
Lymphs Abs: 1.7 10*3/uL (ref 0.7–4.0)
MCH: 31.6 pg (ref 26.0–34.0)
MCHC: 35 g/dL (ref 30.0–36.0)
MCV: 90.3 fL (ref 80.0–100.0)
Monocytes Absolute: 0.5 10*3/uL (ref 0.1–1.0)
Monocytes Relative: 10 %
Neutro Abs: 2.3 10*3/uL (ref 1.7–7.7)
Neutrophils Relative %: 48 %
Platelets: 209 10*3/uL (ref 150–400)
RBC: 4.65 MIL/uL (ref 4.22–5.81)
RDW: 11.9 % (ref 11.5–15.5)
WBC: 4.7 10*3/uL (ref 4.0–10.5)
nRBC: 0 % (ref 0.0–0.2)

## 2019-07-08 LAB — COMPREHENSIVE METABOLIC PANEL
ALT: 34 U/L (ref 0–44)
AST: 30 U/L (ref 15–41)
Albumin: 4.7 g/dL (ref 3.5–5.0)
Alkaline Phosphatase: 55 U/L (ref 38–126)
Anion gap: 7 (ref 5–15)
BUN: 12 mg/dL (ref 6–20)
CO2: 27 mmol/L (ref 22–32)
Calcium: 9.2 mg/dL (ref 8.9–10.3)
Chloride: 104 mmol/L (ref 98–111)
Creatinine, Ser: 0.99 mg/dL (ref 0.61–1.24)
GFR calc Af Amer: >60 mL/min (ref >60)
GFR calc non Af Amer: >60 mL/min (ref >60)
Glucose, Bld: 88 mg/dL (ref 70–99)
Potassium: 3.9 mmol/L (ref 3.5–5.1)
Sodium: 138 mmol/L (ref 135–145)
Total Bilirubin: 0.8 mg/dL (ref 0.3–1.2)
Total Protein: 8.4 g/dL — ABNORMAL HIGH (ref 6.5–8.1)

## 2019-07-08 LAB — LIPASE, BLOOD: Lipase: 20 U/L (ref 11–51)

## 2019-07-08 MED ORDER — ACETAMINOPHEN 325 MG PO TABS
650.0000 mg | ORAL_TABLET | Freq: Once | ORAL | Status: AC
  Administered 2019-07-08: 650 mg via ORAL
  Filled 2019-07-08: qty 2

## 2019-07-08 NOTE — ED Notes (Signed)
Patient ambulatory to lobby with steady gait and NAD noted. Verbalized understanding of discharge instructions and follow-up care. Patient provided with detox information by TTS.

## 2019-07-08 NOTE — ED Provider Notes (Signed)
[facility] Emergency Department Provider Note   ____________________________________________    I have reviewed the triage vital signs and the nursing notes.   HISTORY  Chief Complaint Headache and Diarrhea     HPI Kyle Barnes is a 48 y.o. male who presents with complaints of headache, loose stools and some abdominal discomfort.  Patient reports over the last week he has had burning in his epigastrium when he tries to drink his typical amount of alcohol, which he notes is significant.  Hence he has not been drinking the same as he usually does, he has had global throbbing headache which is been intermittent.  No neuro deficits.  No nausea or vomiting.  Also notes some loose stools.    Past Medical History:  Diagnosis Date  . Carpal tunnel syndrome   . HIV (human immunodeficiency virus infection) ([place])   . Hypertension   . Pyelonephritis 09/29/2016    Patient Active Problem List   Diagnosis Date Noted  . Pyelonephritis 09/29/2016  . UTI (urinary tract infection) 09/29/2016    Past Surgical History:  Procedure Laterality Date  . FRACTURE SURGERY      Prior to Admission medications   Medication Sig Start Date End Date Taking? Authorizing Provider  cephALEXin (KEFLEX) 500 MG capsule Take 1 capsule (500 mg total) by mouth 4 (four) times daily. 01/28/19   Veryl Speak, MD  cyclobenzaprine (FLEXERIL) 10 MG tablet Take 1 tablet (10 mg total) by mouth 2 (two) times daily as needed for muscle spasms. 12/27/17   Kirichenko, Lahoma Rocker, PA-C  elvitegravir-cobicistat-emtricitabine-tenofovir (GENVOYA) 150-150-200-10 MG TABS tablet Take 1 tablet by mouth daily with breakfast.    [provider]  gabapentin (NEURONTIN) 100 MG capsule Take 100 mg by mouth 3 (three) times daily.    [provider]  hydrochlorothiazide (HYDRODIURIL) 12.5 MG tablet Take 12.5 mg by mouth daily.    [provider]  HYDROcodone-acetaminophen  (NORCO/VICODIN) 5-325 MG tablet Take 1 tablet by mouth every 4 (four) hours as needed. 10/01/16   Isla Pence, MD  lisinopril (PRINIVIL,ZESTRIL) 10 MG tablet Take 10 mg by mouth daily.    [provider]  oxyCODONE-acetaminophen (PERCOCET) 5-325 MG tablet Take 1-2 tablets by mouth every 8 (eight) hours as needed. 12/20/17   Earleen Newport, MD  predniSONE (STERAPRED UNI-PAK 21 TAB) 10 MG (21) TBPK tablet Take by mouth daily. Dispense steroid taper pack as directed 12/20/17   Earleen Newport, MD  traMADol (ULTRAM) 50 MG tablet Take 1 tablet (50 mg total) by mouth every 6 (six) hours as needed. 12/27/17   Jeannett Senior, PA-C     Allergies Patient has no known allergies.  No family history on file.  Social History Social History   Tobacco Use  . Smoking status: Never Smoker  . Smokeless tobacco: Never Used  Substance Use Topics  . Alcohol use: Yes    Alcohol/week: 2.0 standard drinks    Types: 2 Shots of liquor per week    Comment: daily  . Drug use: No    Review of Systems  Constitutional: No fever/chills Eyes: No visual changes.  ENT: No neck pain Cardiovascular: Denies chest pain. Respiratory: Denies shortness of breath. Gastrointestinal: As above Genitourinary: Negative for dysuria. Musculoskeletal: Negative for back pain. Skin: Negative for rash. Neurological: As above   ____________________________________________   PHYSICAL EXAM:  VITAL SIGNS: ED Triage Vitals  Enc Vitals Group     BP 07/08/19 0822 (!) 158/107     Pulse  Rate 07/08/19 0822 85     Resp 07/08/19 0822 18     Temp 07/08/19 0822 98.6 F (37 C)     Temp Source 07/08/19 0822 Oral     SpO2 07/08/19 0822 97 %     Weight 07/08/19 0821 117.9 kg (260 lb)     Height 07/08/19 0821 1.905 m (6\' 3" )     Head Circumference --      Peak Flow --      Pain Score 07/08/19 0821 2     Pain Loc --      Pain Edu? --      Excl. in GC? --     Constitutional: Alert and oriented. No  acute distress.   Nose: No congestion/rhinnorhea. Mouth/Throat: Mucous membranes are moist.   Neck:  Painless ROM Cardiovascular: No tachycardia, regular rhythm. Grossly normal heart sounds.  Good peripheral circulation. Respiratory: Normal respiratory effort.  No retractions. Lungs CTAB. Gastrointestinal: Soft and nontender. No distention.  No CVA tenderness.  Reassuring exam Genitourinary: deferred Musculoskeletal:   Warm and well perfused Neurologic:  Normal speech and language. No gross focal neurologic deficits are appreciated.  Not tremulous Skin:  Skin is warm, dry and intact. No rash noted. Psychiatric: Mood and affect are normal. Speech and behavior are normal.  ____________________________________________   LABS (all labs ordered are listed, but only abnormal results are displayed)  Labs Reviewed  COMPREHENSIVE METABOLIC PANEL - Abnormal; Notable for the following components:      Result Value   Total Protein 8.4 (*)    All other components within normal limits  CBC WITH DIFFERENTIAL/PLATELET  LIPASE, BLOOD   ____________________________________________  EKG  None ____________________________________________  RADIOLOGY  None ____________________________________________   PROCEDURES  Procedure(s) performed: No  Procedures   Critical Care performed: No ____________________________________________   INITIAL IMPRESSION / ASSESSMENT AND PLAN / ED COURSE  Pertinent labs & imaging results that were available during my care of the patient were reviewed by me and considered in my medical decision making (see chart for details).  Patient well-appearing and in no acute distress.  I suspect his symptoms are related to gastritis from alcohol intake followed by withdrawal from decreased alcohol intake.  His lab work today is reassuring, lipase is normal, LFTs are unremarkable.  Abdominal exam is reassuring.  He is tolerating p.o.'s.  He did have TTS provide  outpatient detox options which she requested.  He has no interest in staying in the hospital today and do not see an indication for such.  Will treat headache with p.o. medications, appropriate for discharge at this time, return precautions discussed   ____________________________________________   FINAL CLINICAL IMPRESSION(S) / ED DIAGNOSES  Final diagnoses:  Diarrhea, unspecified type  Alcohol withdrawal      Note:  This document was prepared using Dragon voice recognition software and may include unintentional dictation errors.   Jene EveryKinner, Arrian Manson, MD 07/08/19 1453

## 2019-07-08 NOTE — ED Triage Notes (Signed)
Pt presents to ED via POV with c/o HA and diarrhea. Pt states HA x 1 week, pt states diarrhea for more than 1 week. Pt states 2-3 episodes of diarrhea in last 24 hrs. Pt is A7O x 4, afebrile on arrival. Pt states has been in self quarantine since last week due to exposure to known covid+ patient.

## 2019-12-02 ENCOUNTER — Other Ambulatory Visit: Payer: Self-pay

## 2019-12-02 ENCOUNTER — Emergency Department (HOSPITAL_BASED_OUTPATIENT_CLINIC_OR_DEPARTMENT_OTHER)
Admission: EM | Admit: 2019-12-02 | Discharge: 2019-12-03 | Disposition: A | Discharge status: Home / Self Care | Payer: Private Health Insurance - Indemnity | Attending: Emergency Medicine | Admitting: Emergency Medicine

## 2019-12-02 ENCOUNTER — Encounter (HOSPITAL_BASED_OUTPATIENT_CLINIC_OR_DEPARTMENT_OTHER): Payer: Self-pay

## 2019-12-02 DIAGNOSIS — R6889 Other general symptoms and signs: Secondary | ICD-10-CM | POA: Insufficient documentation

## 2019-12-02 DIAGNOSIS — Z5321 Procedure and treatment not carried out due to patient leaving prior to being seen by health care provider: Secondary | ICD-10-CM | POA: Insufficient documentation

## 2019-12-02 NOTE — ED Triage Notes (Signed)
Pt c/o flu like sx x 2-3 days-NAD-steady gait
